# Patient Record
Sex: Female | Born: 1975 | Race: White | Hispanic: No | Marital: Married | Smoking: Never smoker
Health system: Southern US, Community
[De-identification: ages and names within clinical notes are randomized; demographics above are authoritative.]

## PROBLEM LIST (undated history)

## (undated) DIAGNOSIS — K219 Gastro-esophageal reflux disease without esophagitis: Secondary | ICD-10-CM

## (undated) DIAGNOSIS — N289 Disorder of kidney and ureter, unspecified: Secondary | ICD-10-CM

## (undated) DIAGNOSIS — N200 Calculus of kidney: Secondary | ICD-10-CM

## (undated) HISTORY — PX: KNEE SURGERY: SHX244

## (undated) HISTORY — DX: Gastro-esophageal reflux disease without esophagitis: K21.9

## (undated) HISTORY — PX: CHOLECYSTECTOMY: SHX55

---
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
## 2005-02-10 ENCOUNTER — Emergency Department (HOSPITAL_COMMUNITY): Admission: EM | Admit: 2005-02-10 | Discharge: 2005-02-10 | Payer: Self-pay | Admitting: Emergency Medicine

## 2007-10-16 ENCOUNTER — Encounter: Payer: Self-pay | Admitting: Family Medicine

## 2007-10-16 LAB — CONVERTED CEMR LAB: Free T4: 7 ng/dL

## 2008-01-07 ENCOUNTER — Ambulatory Visit: Payer: Self-pay | Admitting: Family Medicine

## 2008-01-07 DIAGNOSIS — R1013 Epigastric pain: Secondary | ICD-10-CM

## 2008-01-07 DIAGNOSIS — R1011 Right upper quadrant pain: Secondary | ICD-10-CM | POA: Insufficient documentation

## 2008-01-07 DIAGNOSIS — J45909 Unspecified asthma, uncomplicated: Secondary | ICD-10-CM | POA: Insufficient documentation

## 2008-01-07 DIAGNOSIS — K3189 Other diseases of stomach and duodenum: Secondary | ICD-10-CM | POA: Insufficient documentation

## 2008-01-07 LAB — CONVERTED CEMR LAB
Blood in Urine, dipstick: NEGATIVE
Glucose, Urine, Semiquant: NEGATIVE
Ketones, urine, test strip: NEGATIVE
Protein, U semiquant: NEGATIVE
Specific Gravity, Urine: 1.015
pH: 5.5

## 2008-01-08 ENCOUNTER — Encounter: Payer: Self-pay | Admitting: Family Medicine

## 2008-01-11 LAB — CONVERTED CEMR LAB
AST: 16 units/L (ref 0–37)
Albumin: 4.5 g/dL (ref 3.5–5.2)
BUN: 10 mg/dL (ref 6–23)
CO2: 25 meq/L (ref 19–32)
Calcium: 9.4 mg/dL (ref 8.4–10.5)
Chloride: 104 meq/L (ref 96–112)
Creatinine, Ser: 0.71 mg/dL (ref 0.40–1.20)
Glucose, Bld: 94 mg/dL (ref 70–99)
HCT: 42.8 % (ref 36.0–46.0)
Hemoglobin: 14.3 g/dL (ref 12.0–15.0)
Potassium: 4.1 meq/L (ref 3.5–5.3)
RBC: 4.7 M/uL (ref 3.87–5.11)
WBC: 12.6 10*3/uL — ABNORMAL HIGH (ref 4.0–10.5)

## 2008-01-26 ENCOUNTER — Encounter: Payer: Self-pay | Admitting: Family Medicine

## 2008-02-01 ENCOUNTER — Ambulatory Visit: Payer: Self-pay | Admitting: Family Medicine

## 2008-02-01 DIAGNOSIS — N912 Amenorrhea, unspecified: Secondary | ICD-10-CM | POA: Insufficient documentation

## 2008-02-02 LAB — CONVERTED CEMR LAB: hCG, Beta Chain, Quant, S: <2 milliintl units/mL

## 2008-03-08 ENCOUNTER — Encounter: Payer: Self-pay | Admitting: Family Medicine

## 2008-03-08 LAB — CONVERTED CEMR LAB: TSH: 1.63 microintl units/mL

## 2008-07-05 ENCOUNTER — Telehealth (INDEPENDENT_AMBULATORY_CARE_PROVIDER_SITE_OTHER): Payer: Self-pay | Admitting: *Deleted

## 2008-07-06 ENCOUNTER — Ambulatory Visit: Payer: Self-pay | Admitting: Family Medicine

## 2008-07-06 DIAGNOSIS — J019 Acute sinusitis, unspecified: Secondary | ICD-10-CM | POA: Insufficient documentation

## 2009-11-08 ENCOUNTER — Ambulatory Visit: Payer: Self-pay | Admitting: Family Medicine

## 2009-11-08 DIAGNOSIS — D239 Other benign neoplasm of skin, unspecified: Secondary | ICD-10-CM | POA: Insufficient documentation

## 2009-11-25 ENCOUNTER — Encounter: Payer: Self-pay | Admitting: Family Medicine

## 2010-08-07 NOTE — Assessment & Plan Note (Signed)
Summary: skin check   Vital Signs:  Patient profile:   35 year old female Height:      64 inches Weight:      233 pounds BMI:     40.14 O2 Sat:      100 % on Room air Pulse rate:   90 / minute BP sitting:   122 / 76  (left arm) Cuff size:   large  Vitals Entered By: Payton Spark CMA (Nov 08, 2009 4:02 PM)  O2 Flow:  Room air CC: Discuss moles. Has noticed more moles since pregnancy.    Primary Care Provider:  Seymour Bars DO  CC:  Discuss moles. Has noticed more moles since pregnancy. .  History of Present Illness: 35 yo WF presents for a change in her moles since she was pregnant.  She had 2 atypical moles removed in the past and denies a hx of SCC, BCC or melanoma.  She  has a hx of childhood sunburns and her Grandma had melanoma.    Current Medications (verified): 1)  Proventil Hfa 108 (90 Base) Mcg/act  Aers (Albuterol Sulfate) .... 2 Puff As Needed 2)  Beyaz 3-0.02-0.451 Mg Tabs (Drospiren-Eth Estrad-Levomefol) .... Take 1 Tab By Mouth Once Daily  Allergies (verified): 1)  ! Codeine 2)  ! Oxycodone Hcl 3)  ! * Pulmicort  Past History:  Past Medical History: G2P2 mild intermittent asthma (allergic rxn to pulmicort) GERD normal barium swallow 4-09 obese  Past Surgical History: LTCS 12-06, 06-2009 appendectomy 1989  Social History: Reviewed history from 01/07/2008 and no changes required. 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 Never smoked. Denies ETOH. No exericse. 222 pre-pregnancy weight. Married to [place], has a toddler son and an infant daughter  Review of Systems      See HPI  Physical Exam  General:  alert, well-developed, well-nourished, and well-hydrated.  obese Skin:  color normal.  several pale appearing SKs, pink pearly raised papule proximal R forearm.  tiny round freckle on the R thumb and a flat brown spreading macule on the L cheek Cervical Nodes:  No lymphadenopathy noted Psych:  good eye  contact, not anxious appearing, and not depressed appearing.     Impression & Recommendations:  Problem # 1:  NEVUS, ATYPICAL (ICD-216.9) Fairly benign appearing skin exam but concern over pearly pink papule on the R forearm -- possible BCC and fam hx of melanoma.   REfer to Dr Vernell Barrier in [place].  Educated on sunscreen use. Orders: Dermatology Referral ([facility])  Complete Medication List: 1)  Proventil Hfa 108 (90 Base) Mcg/act Aers (Albuterol sulfate) .... 2 puff as needed 2)  Beyaz 3-0.02-0.451 Mg Tabs (Drospiren-eth estrad-levomefol) .... Take 1 tab by mouth once daily  Patient Instructions: 1)  Will get you in with Dr Vernell Barrier for a skin check.

## 2010-08-07 NOTE — Consult Note (Signed)
Summary: Gae Dry MD Dermatology  Gae Dry MD Dermatology   Imported By: Lanelle Bal 12/07/2009 09:10:21  _____________________________________________________________________  External Attachment:    Type:   Image     Comment:   External Document

## 2010-11-13 ENCOUNTER — Telehealth: Payer: Self-pay | Admitting: Family Medicine

## 2010-11-13 NOTE — Telephone Encounter (Signed)
Pt called and thinks she has UTI.  Wants ab tx or suggestion for OTC medication.   Plan:  Pts call returned, and had to [facility].  Told pt will possibly need to see in office as nurse visit, but told to call back and speak with the triage nurse. Pending pt callback. Jarvis Newcomer, LPN [facility]

## 2010-11-16 ENCOUNTER — Ambulatory Visit (INDEPENDENT_AMBULATORY_CARE_PROVIDER_SITE_OTHER): Payer: PRIVATE HEALTH INSURANCE | Admitting: Family Medicine

## 2010-11-16 ENCOUNTER — Encounter: Payer: Self-pay | Admitting: Family Medicine

## 2010-11-16 VITALS — BP 131/85 | HR 83 | Temp 98.6°F | Wt 236.0 lb

## 2010-11-16 DIAGNOSIS — N390 Urinary tract infection, site not specified: Secondary | ICD-10-CM

## 2010-11-16 LAB — POCT URINALYSIS DIPSTICK
Glucose, UA: NEGATIVE
Spec Grav, UA: 1.01
Urobilinogen, UA: 0.2
pH, UA: 7

## 2010-11-16 MED ORDER — CIPROFLOXACIN HCL 250 MG PO TABS: 250.0000 mg | ORAL_TABLET | Freq: Two times a day (BID) | ORAL | Status: AC

## 2010-11-16 NOTE — Patient Instructions (Signed)
Take Cipro 2 x a day for 3 days for UTI.  Drink lots of water.  Call if symptoms not resolved by Monday.

## 2010-11-16 NOTE — Progress Notes (Signed)
  Subjective:    Patient ID: Kathy Kaiser, female    DOB: 18-Jan-1976, 35 y.o.   MRN: [id number]  HPI 35 yo WF presents for dysuria x 4 days, frequency, urgency.  She is trying to drink more water.  Tried AZO but it made her nauseated.  She has not had a UTI since pregancy a year ago.  No blood in the urine.  No vaginal discharge.  She had low grade fevers and chills.  She has had HAs.  She has some R flank pain, no pelvic pain.  No incontinence. No vomitting, a little nausea.    BP 131/85  Pulse 83  Temp(Src) 98.6 F (37 C) (Oral)  Wt 236 lb (107.049 kg)  SpO2 100%  LMP 09/26/2010     Review of Systems as per HPI     Objective:   Physical Exam  Constitutional: She appears well-developed and well-nourished. No distress.       obese  Cardiovascular: Normal rate, regular rhythm and normal heart sounds.   Pulmonary/Chest: Effort normal and breath sounds normal.  Abdominal: Soft. Bowel sounds are normal. She exhibits no distension. There is tenderness (slight suprapubic TTP). There is no rebound and no guarding.       No CVAT  Musculoskeletal: She exhibits no edema.  Skin: Skin is warm and dry.  Psychiatric: She has a normal mood and affect.          Assessment & Plan:  UTI - small Leuks on UA with symptoms c/w UTI. Will treat with Cipro 250 mg bid x 3 days.  Drink lots of water.  Cranberry tabs OK. Call if symptoms have not resolved in 72 hrs.

## 2010-12-06 NOTE — Telephone Encounter (Signed)
No response from the patient as of 12-06-10. Jarvis Newcomer, LPN [facility]

## 2011-11-07 ENCOUNTER — Encounter: Payer: Self-pay | Admitting: Family Medicine

## 2011-11-07 ENCOUNTER — Ambulatory Visit (INDEPENDENT_AMBULATORY_CARE_PROVIDER_SITE_OTHER): Payer: PRIVATE HEALTH INSURANCE | Admitting: Family Medicine

## 2011-11-07 VITALS — BP 109/74 | HR 79 | Ht 64.25 in | Wt 219.0 lb

## 2011-11-07 DIAGNOSIS — Z0000 Encounter for general adult medical examination without abnormal findings: Secondary | ICD-10-CM

## 2011-11-07 DIAGNOSIS — E663 Overweight: Secondary | ICD-10-CM

## 2011-11-07 LAB — COMPLETE METABOLIC PANEL WITH GFR
AST: 23 U/L (ref 0–37)
Alkaline Phosphatase: 37 U/L — ABNORMAL LOW (ref 39–117)
BUN: 10 mg/dL (ref 6–23)
Calcium: 9.1 mg/dL (ref 8.4–10.5)
Creat: 0.68 mg/dL (ref 0.50–1.10)
Total Bilirubin: 0.6 mg/dL (ref 0.3–1.2)

## 2011-11-07 LAB — LIPID PANEL
Cholesterol: 186 mg/dL (ref 0–200)
HDL: 39 mg/dL — ABNORMAL LOW (ref >39)
Total CHOL/HDL Ratio: 4.8 Ratio
Triglycerides: 152 mg/dL — ABNORMAL HIGH (ref <150)
VLDL: 30 mg/dL (ref 0–40)

## 2011-11-07 MED ORDER — ALBUTEROL SULFATE HFA 108 (90 BASE) MCG/ACT IN AERS: 2.0000 | INHALATION_SPRAY | RESPIRATORY_TRACT | Status: DC | PRN

## 2011-11-07 NOTE — Patient Instructions (Signed)
Start a regular exercise program and make sure you are eating a healthy diet Try to eat 4 servings of dairy a day or take a calcium supplement (500mg twice a day). Your vaccines are up to date.   

## 2011-11-07 NOTE — Progress Notes (Signed)
  Subjective:     Kathy Kaiser is a 36 y.o. female and is here for a comprehensive physical exam. The patient reports no problems.  Pap and breast exam are up to date per GYN.   History   Social History  . Marital Status: Married    Spouse Name: Barbara Cower    Number of Children: 2  . Years of Education: N/A   Occupational History  . Case Manager    Social History Main Topics  . Smoking status: Never Smoker   . Smokeless tobacco: Not on file  . Alcohol Use: No  . Drug Use: No  . Sexually Active: Yes -- Female partner(s)   Other Topics Concern  . Not on file   Social History Narrative   Occ exercise. Daily caffeine 1-2 per day.    Health Maintenance  Topic Date Due  . Influenza Vaccine  04/07/2012  . Pap Smear  10/24/2014  . Tetanus/tdap  04/07/2021    The following portions of the patient's history were reviewed and updated as appropriate: allergies, current medications, past family history, past medical history, past social history, past surgical history and problem list.  Review of Systems A comprehensive review of systems was negative.   Objective:    BP 109/74  Pulse 79  Ht 5' 4.25" (1.632 m)  Wt 219 lb (99.338 kg)  BMI 37.30 kg/m2 General appearance: alert, cooperative and appears stated age, mildly obese Head: Normocephalic, without obvious abnormality, atraumatic Eyes: conj clear, EOMi, PERRLA Ears: normal TM's and external ear canals both ears Nose: Nares normal. Septum midline. Mucosa normal. No drainage or sinus tenderness. Throat: lips, mucosa, and tongue normal; teeth and gums normal Neck: no adenopathy, no carotid bruit, no JVD, supple, symmetrical, trachea midline and thyroid not enlarged, symmetric, no tenderness/mass/nodules Back: symmetric, no curvature. ROM normal. No CVA tenderness. Lungs: clear to auscultation bilaterally Heart: regular rate and rhythm, S1, S2 normal, no murmur, click, rub or gallop Abdomen: soft, non-tender; bowel sounds normal; no  masses,  no organomegaly Extremities: extremities normal, atraumatic, no cyanosis or edema Pulses: 2+ and symmetric Skin: Skin color, texture, turgor normal. No rashes or lesions Lymph nodes: Cervical, supraclavicular, and axillary nodes normal. Neurologic: Alert and oriented X 3, normal strength and tone. Normal symmetric reflexes. Normal coordination and gait    Assessment:    Healthy female exam.      Plan:     See After Visit Summary for Counseling Recommendations  Start a regular exercise program and make sure you are eating a healthy diet Try to eat 4 servings of dairy a day or take a calcium supplement (500mg  twice a day). Your vaccines are up to date.  Given slip for CMP, lipids.   Had normal TSH a cuple of months ago at GYN office.

## 2012-03-13 ENCOUNTER — Ambulatory Visit: Payer: PRIVATE HEALTH INSURANCE | Admitting: Sports Medicine

## 2012-03-13 ENCOUNTER — Encounter: Payer: Self-pay | Admitting: Sports Medicine

## 2012-03-13 ENCOUNTER — Ambulatory Visit (INDEPENDENT_AMBULATORY_CARE_PROVIDER_SITE_OTHER): Payer: PRIVATE HEALTH INSURANCE | Admitting: Sports Medicine

## 2012-03-13 VITALS — BP 122/80 | HR 86 | Temp 97.7°F | Resp 16 | Wt 224.0 lb

## 2012-03-13 DIAGNOSIS — J45909 Unspecified asthma, uncomplicated: Secondary | ICD-10-CM

## 2012-03-13 MED ORDER — AZITHROMYCIN 250 MG PO TABS: ORAL_TABLET | ORAL | Status: AC

## 2012-03-13 MED ORDER — PREDNISONE 50 MG PO TABS: ORAL_TABLET | ORAL | Status: AC

## 2012-03-13 MED ORDER — ALBUTEROL SULFATE HFA 108 (90 BASE) MCG/ACT IN AERS: 2.0000 | INHALATION_SPRAY | RESPIRATORY_TRACT | Status: DC | PRN

## 2012-03-13 NOTE — Progress Notes (Signed)
Patient ID: Kathy Kaiser, female   DOB: [birth year], 36 y.o.   MRN: [id number] Subjective:    CC: cough  HPI: Kathy Kaiser is a very pleasant 36 year old female with a known history of mild intermittent asthma. Normally she does not get symptoms at night, and less than monthly during the day. She is only on albuterol. Unfortunately over the past few days she's noted increased usage, up to 8 times per day of her albuterol pump as well as cough, wheeze, and shortness of breath. She denies a production, denies any fevers, or chills. She denies any chest pain.  Past medical history, Surgical history, Family history, Social history, Allergies, and medications have been entered into the medical record, reviewed, and no changes needed.   Review of Systems: No fevers, chills, night sweats, weight loss, chest pain, or shortness of breath.   Objective:    General: Well Developed, well nourished, and in no acute distress.  Neuro: Alert and oriented x3, extra-ocular muscles intact.  HEENT: Normocephalic, atraumatic, pupils equal round reactive to light, neck supple, no masses, no lymphadenopathy, thyroid nonpalpable.  Skin: Warm and dry, no rashes. Cardiac: Regular rate and rhythm, no murmurs rubs or gallops.  Respiratory: Clear to auscultation bilaterally. Not using accessory muscles, speaking in full sentences.   Impression and Recommendations:

## 2012-03-13 NOTE — Assessment & Plan Note (Signed)
She's currently having a mild exacerbation. We'll treat her conservatively, prednisone, azithromycin, and refill her albuterol. She does note that she rarely gets symptoms, day or night, so I do not think we need to place her on a daily controller medicine.

## 2012-03-19 ENCOUNTER — Ambulatory Visit (INDEPENDENT_AMBULATORY_CARE_PROVIDER_SITE_OTHER): Payer: PRIVATE HEALTH INSURANCE

## 2012-03-19 ENCOUNTER — Telehealth: Payer: Self-pay | Admitting: Sports Medicine

## 2012-03-19 ENCOUNTER — Telehealth: Payer: Self-pay | Admitting: *Deleted

## 2012-03-19 DIAGNOSIS — R0602 Shortness of breath: Secondary | ICD-10-CM

## 2012-03-19 DIAGNOSIS — R05 Cough: Secondary | ICD-10-CM

## 2012-03-19 DIAGNOSIS — R059 Cough, unspecified: Secondary | ICD-10-CM

## 2012-03-19 NOTE — Telephone Encounter (Signed)
Pt informed

## 2012-03-19 NOTE — Telephone Encounter (Signed)
Go ahead and get a chest x-ray. I will place the orders. Should this be negative, with no sign of pneumonia, the tincture of time will be our saving grace.

## 2012-03-19 NOTE — Telephone Encounter (Signed)
Pt states she has taken abx and that she is using inhaler TID. States she doesn't feel as well as she thinks she should be feeling. Please advise.

## 2012-03-19 NOTE — Telephone Encounter (Signed)
Please let Kathy Kaiser of her x-rays are suggestive of findings classic with bronchitis. There is no sign of pneumonia. She will get better with time.

## 2012-03-20 NOTE — Telephone Encounter (Signed)
Pt informed

## 2012-05-14 ENCOUNTER — Encounter: Payer: Self-pay | Admitting: Family Medicine

## 2012-05-14 ENCOUNTER — Ambulatory Visit (INDEPENDENT_AMBULATORY_CARE_PROVIDER_SITE_OTHER): Payer: PRIVATE HEALTH INSURANCE | Admitting: Family Medicine

## 2012-05-14 VITALS — BP 115/71 | HR 75 | Ht 65.0 in | Wt 223.0 lb

## 2012-05-14 DIAGNOSIS — J329 Chronic sinusitis, unspecified: Secondary | ICD-10-CM

## 2012-05-14 DIAGNOSIS — J309 Allergic rhinitis, unspecified: Secondary | ICD-10-CM

## 2012-05-14 MED ORDER — AMOXICILLIN-POT CLAVULANATE 875-125 MG PO TABS: 1.0000 | ORAL_TABLET | Freq: Two times a day (BID) | ORAL | Status: DC

## 2012-05-14 MED ORDER — FLUTICASONE PROPIONATE 50 MCG/ACT NA SUSP: 2.0000 | Freq: Every day | NASAL | Status: DC

## 2012-05-14 NOTE — Patient Instructions (Signed)

## 2012-05-14 NOTE — Progress Notes (Signed)
  Subjective:    Patient ID: Kathy Kaiser, female    DOB: [birth year], 36 y.o.   MRN: [id number]  HPI Sinusitis x 1 week, green nasal mucous. No runny nose. + Cough, + HA.  HA near her left eye.  Pain over maxillary and frontal sinuses bilaterally.  Has tried mucinex, tylenol flu and cold.  Has used netti pot with brief relief.  No fever. No myalgias.  No GI sxs.  Has had to use her albuterol twice in the last week. Usually gets a sinus infection in the fall.  Dx with bronchits in September with Azith and prednisone.     Review of Systems     Objective:   Physical Exam  Constitutional: She is oriented to person, place, and time. She appears well-developed and well-nourished.  HENT:  Head: Normocephalic and atraumatic.  Right Ear: External ear normal.  Left Ear: External ear normal.  Nose: Nose normal.  Mouth/Throat: Oropharynx is clear and moist.       TMs and canals are clear.   Eyes: Conjunctivae normal and EOM are normal. Pupils are equal, round, and reactive to light.  Neck: Neck supple. No thyromegaly present.  Cardiovascular: Normal rate, regular rhythm and normal heart sounds.   Pulmonary/Chest: Effort normal and breath sounds normal. She has no wheezes.  Lymphadenopathy:    She has no cervical adenopathy.  Neurological: She is alert and oriented to person, place, and time.  Skin: Skin is warm and dry.  Psychiatric: She has a normal mood and affect.          Assessment & Plan:  Sinusitis - Will tx with augmentin for 10 days. Call if not better in one week. Continue symptomatic care.  Discussed adding a nasal steroid spray in addition to her zyrtec.

## 2012-06-18 ENCOUNTER — Encounter: Payer: Self-pay | Admitting: Family Medicine

## 2012-06-18 ENCOUNTER — Ambulatory Visit (INDEPENDENT_AMBULATORY_CARE_PROVIDER_SITE_OTHER): Payer: PRIVATE HEALTH INSURANCE | Admitting: Family Medicine

## 2012-06-18 ENCOUNTER — Other Ambulatory Visit: Payer: Self-pay | Admitting: Sports Medicine

## 2012-06-18 VITALS — BP 119/78 | HR 102 | Ht 65.0 in | Wt 227.0 lb

## 2012-06-18 DIAGNOSIS — F419 Anxiety disorder, unspecified: Secondary | ICD-10-CM

## 2012-06-18 DIAGNOSIS — R000 Tachycardia, unspecified: Secondary | ICD-10-CM

## 2012-06-18 DIAGNOSIS — Z862 Personal history of diseases of the blood and blood-forming organs and certain disorders involving the immune mechanism: Secondary | ICD-10-CM

## 2012-06-18 DIAGNOSIS — Z8639 Personal history of other endocrine, nutritional and metabolic disease: Secondary | ICD-10-CM

## 2012-06-18 DIAGNOSIS — F411 Generalized anxiety disorder: Secondary | ICD-10-CM

## 2012-06-18 MED ORDER — CLONAZEPAM 1 MG PO TABS: 0.5000 mg | ORAL_TABLET | Freq: Two times a day (BID) | ORAL | Status: DC | PRN

## 2012-06-18 NOTE — Progress Notes (Signed)
Subjective:    Patient ID: Kathy Kaiser, female    DOB: [birth year], 36 y.o.   MRN: [id number]  HPI  #1 history of thyroid disease. Patient reports last Tuesday or about 10 days ago she started noticing palpitations nervousness extreme anxiety and just feeling close in. The last time this happened was when she was pregnant almost 3 years ago and at that time she was found to have hyperthyroidism with a low TSH. This family history of Hashimoto's disease and other thyroid disease as well. At that time her GYN repeated the TSH a few weeks later and found and was normal. She's had repeat TSH but the best of her knowledge has been probably over a year since it does not appear that she had a TSH done in May. To punctate with a  Review of Systems  HENT: Negative for facial swelling.   Eyes: Negative for discharge and itching.  Respiratory: Negative for apnea, choking and chest tightness.   Cardiovascular: Positive for palpitations. Negative for chest pain and leg swelling.  Psychiatric/Behavioral: Positive for sleep disturbance and agitation. Negative for suicidal ideas, behavioral problems, confusion, self-injury, dysphoric mood and decreased concentration. The patient is nervous/anxious.   All other systems reviewed and are negative.      BP 119/78  Pulse 102  Ht 5\' 5"  (1.651 m)  Wt 227 lb (102.967 kg)  BMI 37.77 kg/m2 Objective:   Physical Exam  Vitals reviewed. Constitutional: She is oriented to person, place, and time. She appears well-developed and well-nourished. She appears distressed.  HENT:  Head: Normocephalic.  Eyes: Pupils are equal, round, and reactive to light.  Neck: Normal range of motion. Neck supple. No tracheal deviation present. No thyromegaly present.  Cardiovascular: Normal rate, regular rhythm and normal heart sounds.   Pulmonary/Chest: Effort normal and breath sounds normal. No respiratory distress. She has no wheezes.  Musculoskeletal: Normal range of motion. She  exhibits no edema.  Neurological: She is alert and oriented to person, place, and time.  Skin: Skin is warm. She is not diaphoretic.  Psychiatric: She has a normal mood and affect. Her behavior is normal.      Results for orders placed in visit on 11/07/11  LIPID PANEL      Component Value Range   Cholesterol 186  0 - 200 mg/dL   Triglycerides 409 (*) <150 mg/dL   HDL 39 (*) >81 mg/dL   Total CHOL/HDL Ratio 4.8     VLDL 30  0 - 40 mg/dL   LDL Cholesterol 191 (*) 0 - 99 mg/dL  COMPLETE METABOLIC PANEL WITH GFR      Component Value Range   Sodium 137  135 - 145 mEq/L   Potassium 4.2  3.5 - 5.3 mEq/L   Chloride 103  96 - 112 mEq/L   CO2 27  19 - 32 mEq/L   Glucose, Bld 94  70 - 99 mg/dL   BUN 10  6 - 23 mg/dL   Creat 4.78  2.95 - 6.21 mg/dL   Total Bilirubin 0.6  0.3 - 1.2 mg/dL   Alkaline Phosphatase 37 (*) 39 - 117 U/L   AST 23  0 - 37 U/L   ALT 34  0 - 35 U/L   Total Protein 7.2  6.0 - 8.3 g/dL   Albumin 4.5  3.5 - 5.2 g/dL   Calcium 9.1  8.4 - 30.8 mg/dL   GFR, Est African American >89     GFR, Est Non African American >  89    EKG within nomal limits. Except for mild sinus arrhythmia.        #1 anxiety disorder/history of hyper thyroidism/palpitations. At this time not completely sure if this is a hyperthyroid attack since her physical examination is essentially benign. Will treat for anxiety with a prescription for Klonopin 1 mg one half to one tablet once twice a day when necessary and obtain lab work for further thyroid evaluation. Patient denies any depression so instead using an SSRI we'll go Klonopin. Followup with Dr. Linford Arnold in about 2 weeks.

## 2012-06-18 NOTE — Patient Instructions (Signed)
Hyperthyroidism The thyroid is a large gland located in the lower front part of your neck. The thyroid helps control metabolism. Metabolism is how your body uses food. It controls metabolism with the hormone thyroxine. When the thyroid is overactive, it produces too much hormone. When this happens, these following problems may occur:   Nervousness  Heat intolerance  Weight loss (in spite of increase food intake)  Diarrhea  Change in hair or skin texture  Palpitations (heart skipping or having extra beats)  Tachycardia (rapid heart rate)  Loss of menstruation (amenorrhea)  Shaking of the hands CAUSES  Grave's Disease (the immune system attacks the thyroid gland). This is the most common cause.  Inflammation of the thyroid gland.  Tumor (usually benign) in the thyroid gland or elsewhere.  Excessive use of thyroid medications (both prescription and 'natural').  Excessive ingestion of Iodine. DIAGNOSIS  To prove hyperthyroidism, your caregiver may do blood tests and ultrasound tests. Sometimes the signs are hidden. It may be necessary for your caregiver to watch this illness with blood tests, either before or after diagnosis and treatment. TREATMENT Short-term treatment There are several treatments to control symptoms. Drugs called beta blockers may give some relief. Drugs that decrease hormone production will provide temporary relief in many people. These measures will usually not give permanent relief. Definitive therapy There are treatments available which can be discussed between you and your caregiver which will permanently treat the problem. These treatments range from surgery (removal of the thyroid), to the use of radioactive iodine (destroys the thyroid by radiation), to the use of antithyroid drugs (interfere with hormone synthesis). The first two treatments are permanent and usually successful. They most often require hormone replacement therapy for life. This is because  it is impossible to remove or destroy the exact amount of thyroid required to make a person euthyroid (normal). HOME CARE INSTRUCTIONS  See your caregiver if the problems you are being treated for get worse. Examples of this would be the problems listed above. SEEK MEDICAL CARE IF: Your general condition worsens. MAKE SURE YOU:   Understand these instructions.  Will watch your condition.  Will get help right away if you are not doing well or get worse. Document Released: 06/24/2005 Document Revised: 09/16/2011 Document Reviewed: 11/05/2006 [facility] Patient Information 2013 [place], [place]. Anxiety and Panic Attacks Your caregiver has informed you that you are having an anxiety or panic attack. There may be many forms of this. Most of the time these attacks come suddenly and without warning. They come at any time of day, including periods of sleep, and at any time of life. They may be strong and unexplained. Although panic attacks are very scary, they are physically harmless. Sometimes the cause of your anxiety is not known. Anxiety is a protective mechanism of the body in its fight or flight mechanism. Most of these perceived danger situations are actually nonphysical situations (such as anxiety over losing a job). CAUSES  The causes of an anxiety or panic attack are many. Panic attacks may occur in otherwise healthy people given a certain set of circumstances. There may be a genetic cause for panic attacks. Some medications may also have anxiety as a side effect. SYMPTOMS  Some of the most common feelings are:  Intense terror.  Dizziness, feeling faint.  Hot and cold flashes.  Fear of going crazy.  Feelings that nothing is real.  Sweating.  Shaking.  Chest pain or a fast heartbeat (palpitations).  Smothering, choking sensations.  Feelings of  impending doom and that death is near.  Tingling of extremities, this may be from over-breathing.  Altered reality  (derealization).  Being detached from yourself (depersonalization). Several symptoms can be present to make up anxiety or panic attacks. DIAGNOSIS  The evaluation by your caregiver will depend on the type of symptoms you are experiencing. The diagnosis of anxiety or panic attack is made when no physical illness can be determined to be a cause of the symptoms. TREATMENT  Treatment to prevent anxiety and panic attacks may include:  Avoidance of circumstances that cause anxiety.  Reassurance and relaxation.  Regular exercise.  Relaxation therapies, such as yoga.  Psychotherapy with a psychiatrist or therapist.  Avoidance of caffeine, alcohol and illegal drugs.  Prescribed medication. SEEK IMMEDIATE MEDICAL CARE IF:   You experience panic attack symptoms that are different than your usual symptoms.  You have any worsening or concerning symptoms. Document Released: 06/24/2005 Document Revised: 09/16/2011 Document Reviewed: 10/26/2009 [facility] Patient Information 2013 [place], [place].

## 2012-06-19 LAB — CBC WITH DIFFERENTIAL/PLATELET
Eosinophils Absolute: 0.2 10*3/uL (ref 0.0–0.7)
Eosinophils Relative: 2 % (ref 0–5)
Lymphs Abs: 2.8 10*3/uL (ref 0.7–4.0)
MCH: 31.2 pg (ref 26.0–34.0)
MCHC: 35.2 g/dL (ref 30.0–36.0)
MCV: 88.7 fL (ref 78.0–100.0)
Platelets: 299 10*3/uL (ref 150–400)
RBC: 4.61 MIL/uL (ref 3.87–5.11)

## 2012-06-19 LAB — COMPREHENSIVE METABOLIC PANEL
ALT: 86 U/L — ABNORMAL HIGH (ref 0–35)
AST: 40 U/L — ABNORMAL HIGH (ref 0–37)
Alkaline Phosphatase: 35 U/L — ABNORMAL LOW (ref 39–117)
Glucose, Bld: 107 mg/dL — ABNORMAL HIGH (ref 70–99)
Sodium: 139 mEq/L (ref 135–145)
Total Bilirubin: 0.6 mg/dL (ref 0.3–1.2)
Total Protein: 7.5 g/dL (ref 6.0–8.3)

## 2012-06-22 ENCOUNTER — Encounter: Payer: Self-pay | Admitting: *Deleted

## 2012-06-24 LAB — THYROID PANEL WITH TSH
Free Thyroxine Index: 2.6 (ref 1.0–3.9)
T3 Uptake: 33.2 % (ref 22.5–37.0)
T4, Total: 7.9 ug/dL (ref 5.0–12.5)
TSH: 1.707 u[IU]/mL (ref 0.350–4.500)

## 2012-07-03 ENCOUNTER — Ambulatory Visit: Payer: PRIVATE HEALTH INSURANCE | Admitting: Family Medicine

## 2012-10-14 ENCOUNTER — Encounter: Payer: Self-pay | Admitting: Family Medicine

## 2012-10-14 ENCOUNTER — Ambulatory Visit (INDEPENDENT_AMBULATORY_CARE_PROVIDER_SITE_OTHER): Payer: PRIVATE HEALTH INSURANCE | Admitting: Family Medicine

## 2012-10-14 ENCOUNTER — Other Ambulatory Visit: Payer: Self-pay | Admitting: Family Medicine

## 2012-10-14 ENCOUNTER — Ambulatory Visit (INDEPENDENT_AMBULATORY_CARE_PROVIDER_SITE_OTHER): Payer: PRIVATE HEALTH INSURANCE

## 2012-10-14 VITALS — BP 120/85 | HR 105 | Ht 65.0 in | Wt 225.0 lb

## 2012-10-14 DIAGNOSIS — IMO0002 Reserved for concepts with insufficient information to code with codable children: Secondary | ICD-10-CM

## 2012-10-14 DIAGNOSIS — S76012A Strain of muscle, fascia and tendon of left hip, initial encounter: Secondary | ICD-10-CM

## 2012-10-14 DIAGNOSIS — M25552 Pain in left hip: Secondary | ICD-10-CM

## 2012-10-14 DIAGNOSIS — M25559 Pain in unspecified hip: Secondary | ICD-10-CM

## 2012-10-14 MED ORDER — METAXALONE 800 MG PO TABS: ORAL_TABLET | ORAL | Status: DC

## 2012-10-14 MED ORDER — HYDROCODONE-ACETAMINOPHEN 5-325 MG PO TABS: 1.0000 | ORAL_TABLET | Freq: Every day | ORAL | Status: DC

## 2012-10-14 MED ORDER — MELOXICAM 15 MG PO TABS: 15.0000 mg | ORAL_TABLET | Freq: Every day | ORAL | Status: DC

## 2012-10-14 NOTE — Progress Notes (Signed)
CC: Kathy Kaiser is a 37 y.o. female is here for Hip Pain   Subjective: HPI:  Patient complains of severe left hip pain. Localized to the left lateral buttock radiating to the side. This came on suddenly of moderate severity while getting out of her car yesterday. Since then has gradually getting worse on an hourly basis. Pain is described as sharp and burning. Present with sitting, walking, lying on left side but slightly improved when standing still. Has tried ibuprofen and Tylenol without improvement at all. Pain is nonradiating. Pain is present all hours of the day. Woke her from sleep rolling over her left side yesterday evening.  Denies weakness or sensory disturbances in the left lower extremity. Denies groin pain, abdominal pain, pelvic pain, genitourinary complaints, vaginal discharge, dysuria, constipation, nor diarrhea. Denies recent trauma or overexertion   Review Of Systems Outlined In HPI  Past Medical History  Diagnosis Date  . GERD (gastroesophageal reflux disease)   . Asthma     mild intermittent     Family History  Problem Relation Age of Onset  . GER disease Mother   . Thyroid disease Mother   . Hypertension Father   . Thyroid disease Sister   . Diabetes Sister     Gestational  . Diabetes type II Father      History  Substance Use Topics  . Smoking status: Never Smoker   . Smokeless tobacco: Not on file  . Alcohol Use: No     Objective: Filed Vitals:   10/14/12 0943  BP: 120/85  Pulse: 105    Vital signs reviewed. General: Alert and Oriented, No Acute Distress HEENT: Pupils equal, round, reactive to light. Conjunctivae clear.  External ears unremarkable.  Moist mucous membranes. Lungs: Clear and comfortable work of breathing, speaking in full sentences without accessory muscle use. Cardiac: Regular rate and rhythm.  Neuro: CN II-XII grossly intact, limping favoring right leg. Full-strength and range of motion in bilateral lower extremities with L4  and S1 DTRs two over four bilaterally. MSK: Evaluation of the left leg: Straight leg raise negative, logroll negative, leg length equal, FABER/FADIR reproduces pain in the left posterior lateral buttock. No pain over left greater trochanter. Pain is reproduced with palpation of posterior lateral pelvic brim.  Extremities: No peripheral edema.  Strong peripheral pulses.  Mental Status: No depression, anxiety, nor agitation. Logical though process. Skin: Warm and dry.  Assessment & Plan: [name] was seen today for hip pain.  Diagnoses and associated orders for this visit:  Left hip pain - Cancel: DG Pelvis 1-2 Views; Future - metaxalone (SKELAXIN) 800 MG tablet; Take one tab every 8 hours only as needed for muscle pain and/or spasm. - meloxicam (MOBIC) 15 MG tablet; Take 1 tablet (15 mg total) by mouth daily. - HYDROcodone-acetaminophen (NORCO/VICODIN) 5-325 MG per tablet; Take 1 tablet by mouth at bedtime.  Muscle strain of gluteal region, left, initial encounter - metaxalone (SKELAXIN) 800 MG tablet; Take one tab every 8 hours only as needed for muscle pain and/or spasm. - meloxicam (MOBIC) 15 MG tablet; Take 1 tablet (15 mg total) by mouth daily. - HYDROcodone-acetaminophen (NORCO/VICODIN) 5-325 MG per tablet; Take 1 tablet by mouth at bedtime.    X-rays of the hip and pelvis were obtained ruled out avulsion fracture. We'll treat as gluteal strain/sprain including rest, Skelaxin and Mobic with nighttime use of hydrocodone. I've asked her to return early next week for sports medicine referral with Dr. Karie Schwalbe. to discuss confirmation of diagnosis  and physical therapy benefits.  Return in about 1 week (around 10/21/2012).

## 2012-11-24 ENCOUNTER — Encounter: Payer: PRIVATE HEALTH INSURANCE | Admitting: Family Medicine

## 2012-12-03 ENCOUNTER — Encounter: Payer: Self-pay | Admitting: Family Medicine

## 2012-12-03 ENCOUNTER — Other Ambulatory Visit: Payer: Self-pay | Admitting: *Deleted

## 2012-12-03 ENCOUNTER — Ambulatory Visit (INDEPENDENT_AMBULATORY_CARE_PROVIDER_SITE_OTHER): Payer: PRIVATE HEALTH INSURANCE | Admitting: Family Medicine

## 2012-12-03 ENCOUNTER — Other Ambulatory Visit: Payer: Self-pay | Admitting: Sports Medicine

## 2012-12-03 VITALS — BP 112/69 | HR 66 | Ht 65.0 in | Wt 220.0 lb

## 2012-12-03 DIAGNOSIS — J45909 Unspecified asthma, uncomplicated: Secondary | ICD-10-CM

## 2012-12-03 DIAGNOSIS — E079 Disorder of thyroid, unspecified: Secondary | ICD-10-CM

## 2012-12-03 DIAGNOSIS — J309 Allergic rhinitis, unspecified: Secondary | ICD-10-CM

## 2012-12-03 DIAGNOSIS — Z0000 Encounter for general adult medical examination without abnormal findings: Secondary | ICD-10-CM

## 2012-12-03 LAB — LIPID PANEL
HDL: 38 mg/dL — ABNORMAL LOW (ref >39)
LDL Cholesterol: 90 mg/dL (ref 0–99)
Total CHOL/HDL Ratio: 3.9 Ratio

## 2012-12-03 LAB — COMPLETE METABOLIC PANEL WITH GFR
ALT: 25 U/L (ref 0–35)
Albumin: 4.7 g/dL (ref 3.5–5.2)
Alkaline Phosphatase: 30 U/L — ABNORMAL LOW (ref 39–117)
CO2: 24 mEq/L (ref 19–32)
Glucose, Bld: 96 mg/dL (ref 70–99)
Potassium: 4.1 mEq/L (ref 3.5–5.3)
Sodium: 135 mEq/L (ref 135–145)
Total Bilirubin: 0.8 mg/dL (ref 0.3–1.2)
Total Protein: 7.1 g/dL (ref 6.0–8.3)

## 2012-12-03 LAB — TSH: TSH: 1.174 u[IU]/mL (ref 0.350–4.500)

## 2012-12-03 MED ORDER — ALBUTEROL SULFATE HFA 108 (90 BASE) MCG/ACT IN AERS: 2.0000 | INHALATION_SPRAY | RESPIRATORY_TRACT | Status: DC | PRN

## 2012-12-03 MED ORDER — BECLOMETHASONE DIPROPIONATE 40 MCG/ACT IN AERS: 2.0000 | INHALATION_SPRAY | Freq: Two times a day (BID) | RESPIRATORY_TRACT | Status: DC

## 2012-12-03 NOTE — Patient Instructions (Signed)
Keep up a regular exercise program and make sure you are eating a healthy diet Try to eat 4 servings of dairy a day, or if you are lactose intolerant take a calcium with vitamin D daily.  Your vaccines are up to date.   

## 2012-12-03 NOTE — Progress Notes (Signed)
  Subjective:     Kathy Kaiser is a 37 y.o. female and is here for a comprehensive physical exam. The patient reports problems - asthma has been flaring lat couple of weeks. Has had to use her albuterol 4 times today.She usually has more problems in Spring.  She is using her nasal steroid spray but not her antihistamine.l   History   Social History  . Marital Status: Married    Spouse Name: Barbara Cower    Number of Children: 2  . Years of Education: N/A   Occupational History  . Case Manager    Social History Main Topics  . Smoking status: Never Smoker   . Smokeless tobacco: Not on file  . Alcohol Use: Yes     Comment: rarely  . Drug Use: No  . Sexually Active: Yes -- Female partner(s)   Other Topics Concern  . Not on file   Social History Narrative   Occ exercise 3-4 days per week. . Daily caffeine 1-2 per day.    Health Maintenance  Topic Date Due  . Influenza Vaccine  03/08/2013  . Pap Smear  12/03/2015  . Tetanus/tdap  04/07/2021    The following portions of the patient's history were reviewed and updated as appropriate: allergies, current medications, past family history, past medical history, past social history, past surgical history and problem list.  Review of Systems A comprehensive review of systems was negative.   Objective:    BP 112/69  Pulse 66  Ht 5\' 5"  (1.651 m)  Wt 220 lb (99.791 kg)  BMI 36.61 kg/m2 General appearance: alert, cooperative and appears stated age Head: Normocephalic, without obvious abnormality, atraumatic Eyes: conj clear EOMi, PEERLA Ears: normal TM's and external ear canals both ears Nose: Nares normal. Septum midline. Mucosa normal. No drainage or sinus tenderness. Throat: lips, mucosa, and tongue normal; teeth and gums normal Neck: Mild asymmetry of the thyroid, larger on the right. No nodules.  No LN.  NEck with NROM.  Back: symmetric, no curvature. ROM normal. No CVA tenderness. Lungs: clear to auscultation bilaterally Heart:  regular rate and rhythm, S1, S2 normal, no murmur, click, rub or gallop Abdomen: soft, non-tender; bowel sounds normal; no masses,  no organomegaly Extremities: extremities normal, atraumatic, no cyanosis or edema Pulses: 2+ and symmetric Skin: Skin color, texture, turgor normal. No rashes or lesions Lymph nodes: Cervical, supraclavicular, and axillary nodes normal. Neurologic: Alert and oriented X 3, normal strength and tone. Normal symmetric reflexes. Normal coordination and gait    Assessment:    Healthy female exam.      Plan:     See After Visit Summary for Counseling Recommendations  Keep up a regular exercise program and make sure you are eating a healthy diet Try to eat 4 servings of dairy a day, or if you are lactose intolerant take a calcium with vitamin D daily.  Your vaccines are up to date.   Asthma - uncontrolled.  Will add inhaled steroid. Will start QVAR 40mg  BID.  F/U in 4 months.    AR- restart antihistamine with the inhaled nasal corticosteroid.    Asymmetrical thyroid - Will refer for [place] for further eval. Has strong fam hx of thyroid prlblems. Check TSH as well.

## 2012-12-08 ENCOUNTER — Ambulatory Visit (INDEPENDENT_AMBULATORY_CARE_PROVIDER_SITE_OTHER): Payer: PRIVATE HEALTH INSURANCE

## 2012-12-08 ENCOUNTER — Encounter: Payer: Self-pay | Admitting: Family Medicine

## 2012-12-08 DIAGNOSIS — E079 Disorder of thyroid, unspecified: Secondary | ICD-10-CM

## 2013-07-09 ENCOUNTER — Encounter: Payer: Self-pay | Admitting: Physician Assistant

## 2013-07-09 ENCOUNTER — Ambulatory Visit (INDEPENDENT_AMBULATORY_CARE_PROVIDER_SITE_OTHER): Payer: PRIVATE HEALTH INSURANCE | Admitting: Physician Assistant

## 2013-07-09 VITALS — BP 121/81 | HR 95 | Wt 221.0 lb

## 2013-07-09 DIAGNOSIS — R0602 Shortness of breath: Secondary | ICD-10-CM

## 2013-07-09 DIAGNOSIS — R05 Cough: Secondary | ICD-10-CM

## 2013-07-09 DIAGNOSIS — J45909 Unspecified asthma, uncomplicated: Secondary | ICD-10-CM

## 2013-07-09 DIAGNOSIS — R059 Cough, unspecified: Secondary | ICD-10-CM

## 2013-07-09 DIAGNOSIS — F411 Generalized anxiety disorder: Secondary | ICD-10-CM

## 2013-07-09 DIAGNOSIS — F410 Panic disorder [episodic paroxysmal anxiety] without agoraphobia: Secondary | ICD-10-CM

## 2013-07-09 MED ORDER — CLONAZEPAM 0.5 MG PO TABS: 0.5000 mg | ORAL_TABLET | Freq: Two times a day (BID) | ORAL | Status: DC | PRN

## 2013-07-09 NOTE — Patient Instructions (Signed)
umcka cold care for cough.  Finish prednisone and amoxicillin.  Decrease albuterol nebs.  klonapin as needed for anxiety.

## 2013-07-09 NOTE — Progress Notes (Signed)
   Subjective:    Patient ID: Kathy Kaiser, female    DOB: December 31, 1975, 38 y.o.   MRN: [id number]  HPI Pt presents to the clinic to follow up for bronchitis and flu like symptoms for over 1 week. Pt was seen in urgent care on 07/03/13 and given amoxicillin and prednisone. Pt has 2 days left of prednisone and 5 days left of abx. She feels better overall but still has cough that is dry and episodes where she feels very SOB like she can't breath in all the way. Denies fever, chills, n/v/d, sinus pressure, ST, ear pain, or wheezing. She would like to be rechecked right now. She admits to being very anxious. She is using abulterol regularly but not helping. Her grandmother is in the hospital dying and she is very worried.   Review of Systems     Objective:   Physical Exam  Constitutional: She is oriented to person, place, and time. She appears well-developed and well-nourished.  HENT:  Head: Normocephalic and atraumatic.  Right Ear: External ear normal.  Left Ear: External ear normal.  Nose: Nose normal.  Mouth/Throat: Oropharynx is clear and moist. No oropharyngeal exudate.  Tm's clear.  Negative maxillary or frontal sinus tenderness.   Eyes: Conjunctivae are normal. Right eye exhibits no discharge. Left eye exhibits no discharge.  Neck: Normal range of motion. Neck supple.  Cardiovascular: Normal rate, regular rhythm and normal heart sounds.   Pulmonary/Chest: Effort normal and breath sounds normal. She has no wheezes.  Lymphadenopathy:    She has no cervical adenopathy.  Neurological: She is alert and oriented to person, place, and time.  Skin: Skin is warm and dry.  Psychiatric: She has a normal mood and affect. Her behavior is normal.          Assessment & Plan:  cough/anxiety/panic attacks/SOb- peak flows in green zone.  reassured pt that lungs sound great. Cough likely post-infectious. Discussed OTC umcka cold care for cough. Offered hycodan pt declined. Encouraged to finish  prednisone and amoxicillin. I would decrease or even stop all together albuterol nebulizer. I think nebs and prednisone are causing more anxiety and panic attacks that are causing pt to feel SOB. I did give klonapin to use PRN when she has a SOB attack.  Likely when finish prednisone and stop albuterol will start to feel better. Continue on qvar for maintence.Call if worsening or not improving.

## 2013-11-10 ENCOUNTER — Ambulatory Visit (INDEPENDENT_AMBULATORY_CARE_PROVIDER_SITE_OTHER): Payer: PRIVATE HEALTH INSURANCE | Admitting: Family Medicine

## 2013-11-10 ENCOUNTER — Telehealth: Payer: Self-pay | Admitting: *Deleted

## 2013-11-10 ENCOUNTER — Encounter: Payer: Self-pay | Admitting: Family Medicine

## 2013-11-10 VITALS — BP 121/76 | HR 82 | Ht 65.0 in | Wt 221.0 lb

## 2013-11-10 DIAGNOSIS — F411 Generalized anxiety disorder: Secondary | ICD-10-CM | POA: Insufficient documentation

## 2013-11-10 DIAGNOSIS — Z0000 Encounter for general adult medical examination without abnormal findings: Secondary | ICD-10-CM

## 2013-11-10 DIAGNOSIS — Z23 Encounter for immunization: Secondary | ICD-10-CM

## 2013-11-10 DIAGNOSIS — J45909 Unspecified asthma, uncomplicated: Secondary | ICD-10-CM

## 2013-11-10 LAB — COMPLETE METABOLIC PANEL WITH GFR
ALT: 24 U/L (ref 0–35)
AST: 18 U/L (ref 0–37)
Albumin: 4.5 g/dL (ref 3.5–5.2)
Alkaline Phosphatase: 29 U/L — ABNORMAL LOW (ref 39–117)
BILIRUBIN TOTAL: 0.8 mg/dL (ref 0.2–1.2)
BUN: 6 mg/dL (ref 6–23)
CO2: 25 mEq/L (ref 19–32)
Calcium: 9.1 mg/dL (ref 8.4–10.5)
Chloride: 104 mEq/L (ref 96–112)
Creat: 0.71 mg/dL (ref 0.50–1.10)
GFR, Est African American: >89 mL/min
GFR, Est Non African American: >89 mL/min
Glucose, Bld: 95 mg/dL (ref 70–99)
Potassium: 4.2 mEq/L (ref 3.5–5.3)
SODIUM: 140 meq/L (ref 135–145)
TOTAL PROTEIN: 7 g/dL (ref 6.0–8.3)

## 2013-11-10 LAB — LIPID PANEL
Cholesterol: 144 mg/dL (ref 0–200)
HDL: 41 mg/dL (ref >39)
LDL CALC: 85 mg/dL (ref 0–99)
TRIGLYCERIDES: 88 mg/dL (ref <150)
Total CHOL/HDL Ratio: 3.5 Ratio
VLDL: 18 mg/dL (ref 0–40)

## 2013-11-10 MED ORDER — FLUOXETINE HCL 10 MG PO CAPS: ORAL_CAPSULE | ORAL | Status: DC

## 2013-11-10 NOTE — Telephone Encounter (Signed)
Sorry, should say one tab daily for one week then increase to 2 tabs daily.

## 2013-11-10 NOTE — Patient Instructions (Signed)
Keep up a regular exercise program and make sure you are eating a healthy diet Try to eat 4 servings of dairy a day, or if you are lactose intolerant take a calcium with vitamin D daily.  Your vaccines are up to date.   

## 2013-11-10 NOTE — Telephone Encounter (Signed)
Pharmacy calls and states got an e-script for Prozac capsules for this patient and the directions say take 1 tab daily for 1 week then increase to 1 tab daily.  Did you mean 1/2 tab and if so she said they are in capsules so can not be cut in half. Clemetine Marker, LPN

## 2013-11-10 NOTE — Progress Notes (Signed)
Subjective:     Kathy Kaiser is a 38 y.o. female and is here for a comprehensive physical exam. The patient reports no problems.  She also feels like her anxiety is been increased recently. She's had a couple weeks ago she felt fine but this last week her anxiety has been very high. She says she's not sure what may have triggered it. She tried taking the clonazepam in the past unfortunately it actually possibly summation so does not want to take this again. She feels that her mood has been up and down more recently. Without any significant stressors that she can pinpoint.   Her asthma has been very well controlled. She's had used her rescue inhaler once in the last month. She is on Qvar daily.  History   Social History  . Marital Status: Married    Spouse Name: Corene Cornea    Number of Children: 2  . Years of Education: N/A   Occupational History  . Case Manager    Social History Main Topics  . Smoking status: Never Smoker   . Smokeless tobacco: Not on file  . Alcohol Use: Yes     Comment: rarely  . Drug Use: No  . Sexual Activity: Yes    Partners: Male   Other Topics Concern  . Not on file   Social History Narrative   Occ exercise 3-4 days per week. . Daily caffeine 1-2 per day.    Health Maintenance  Topic Date Due  . Influenza Vaccine  02/05/2014  . Pap Smear  12/03/2015  . Tetanus/tdap  04/07/2021    The following portions of the patient's history were reviewed and updated as appropriate: allergies, current medications, past family history, past medical history, past social history, past surgical history and problem list.  Review of Systems A comprehensive review of systems was negative.   Objective:    BP 121/76  Pulse 82  Ht 5\' 5"  (1.651 m)  Wt 221 lb (100.245 kg)  BMI 36.78 kg/m2 General appearance: alert, cooperative and appears stated age Head: Normocephalic, without obvious abnormality, atraumatic Eyes: conj clear, EOMi, PEERLA Ears: normal TM's and  external ear canals both ears Nose: Nares normal. Septum midline. Mucosa normal. No drainage or sinus tenderness. Throat: lips, mucosa, and tongue normal; teeth and gums normal Neck: no adenopathy, no carotid bruit, no JVD, supple, symmetrical, trachea midline and thyroid not enlarged, symmetric, no tenderness/mass/nodules Back: symmetric, no curvature. ROM normal. No CVA tenderness. Lungs: clear to auscultation bilaterally Breasts: not insepcted Heart: regular rate and rhythm, S1, S2 normal, no murmur, click, rub or gallop Abdomen: soft, non-tender; bowel sounds normal; no masses,  no organomegaly Extremities: extremities normal, atraumatic, no cyanosis or edema Pulses: 2+ and symmetric Skin: Skin color, texture, turgor normal. No rashes or lesions Lymph nodes: Cervical, supraclavicular, and axillary nodes normal. Neurologic: Alert and oriented X 3, normal strength and tone. Normal symmetric reflexes. Normal coordination and gait    Assessment:    Healthy female exam.     Plan:     See After Visit Summary for Counseling Recommendations  Keep up a regular exercise program and make sure you are eating a healthy diet Try to eat 4 servings of dairy a day, or if you are lactose intolerant take a calcium with vitamin D daily.  Your vaccines are up to date.   GAD- not well controlled right now. We discussed different options. I would like to stay away from the benzodiazepine class and she had hallucinations  on clonazepam. We discussed a daily medication such as an SSRI. Discussed pros and cons and potential side effects of this. We'll start fluoxetine 10 mg daily. Increase to 20 mg after one week. Followup in one month to see how well she tolerated. She's to call the office in the interim if she feels that she's having significant side effects or increase in her symptoms.  Asthma-recommended pneumococcal 23 injection. Patient received today. Well controlled on current regimen. Consider statin  therapy at followup visit if she still well controlled.

## 2013-11-10 NOTE — Telephone Encounter (Signed)
Pharmacy informed.  Oscar La, LPN

## 2013-11-11 LAB — TSH: TSH: 1.165 u[IU]/mL (ref 0.350–4.500)

## 2013-11-16 ENCOUNTER — Encounter: Payer: Self-pay | Admitting: Family Medicine

## 2013-11-30 ENCOUNTER — Encounter: Payer: Self-pay | Admitting: Family Medicine

## 2013-12-20 ENCOUNTER — Ambulatory Visit: Payer: PRIVATE HEALTH INSURANCE | Admitting: Family Medicine

## 2014-03-25 ENCOUNTER — Other Ambulatory Visit: Payer: Self-pay | Admitting: Family Medicine

## 2014-03-25 DIAGNOSIS — J45909 Unspecified asthma, uncomplicated: Secondary | ICD-10-CM

## 2014-03-25 MED ORDER — ALBUTEROL SULFATE HFA 108 (90 BASE) MCG/ACT IN AERS: 2.0000 | INHALATION_SPRAY | RESPIRATORY_TRACT | Status: DC | PRN

## 2014-03-25 MED ORDER — BECLOMETHASONE DIPROPIONATE 40 MCG/ACT IN AERS: 2.0000 | INHALATION_SPRAY | Freq: Two times a day (BID) | RESPIRATORY_TRACT | Status: DC

## 2014-11-16 ENCOUNTER — Encounter: Payer: Self-pay | Admitting: Family Medicine

## 2014-11-16 ENCOUNTER — Ambulatory Visit (INDEPENDENT_AMBULATORY_CARE_PROVIDER_SITE_OTHER): Payer: PRIVATE HEALTH INSURANCE | Admitting: Family Medicine

## 2014-11-16 VITALS — BP 121/84 | HR 79 | Wt 216.0 lb

## 2014-11-16 DIAGNOSIS — Z0000 Encounter for general adult medical examination without abnormal findings: Secondary | ICD-10-CM | POA: Diagnosis not present

## 2014-11-16 DIAGNOSIS — R1032 Left lower quadrant pain: Secondary | ICD-10-CM

## 2014-11-16 DIAGNOSIS — J4520 Mild intermittent asthma, uncomplicated: Secondary | ICD-10-CM | POA: Diagnosis not present

## 2014-11-16 LAB — COMPLETE METABOLIC PANEL WITH GFR
ALBUMIN: 4.3 g/dL (ref 3.5–5.2)
ALT: 22 U/L (ref 0–35)
AST: 13 U/L (ref 0–37)
Alkaline Phosphatase: 28 U/L — ABNORMAL LOW (ref 39–117)
BUN: 11 mg/dL (ref 6–23)
CO2: 25 mEq/L (ref 19–32)
Calcium: 9.1 mg/dL (ref 8.4–10.5)
Chloride: 106 mEq/L (ref 96–112)
Creat: 0.66 mg/dL (ref 0.50–1.10)
GFR, Est African American: >89 mL/min
GLUCOSE: 94 mg/dL (ref 70–99)
POTASSIUM: 4.1 meq/L (ref 3.5–5.3)
Sodium: 140 mEq/L (ref 135–145)
TOTAL PROTEIN: 7 g/dL (ref 6.0–8.3)
Total Bilirubin: 0.6 mg/dL (ref 0.2–1.2)

## 2014-11-16 LAB — LIPID PANEL
CHOL/HDL RATIO: 3.9 ratio
Cholesterol: 154 mg/dL (ref 0–200)
HDL: 39 mg/dL — AB (ref >=46)
LDL Cholesterol: 98 mg/dL (ref 0–99)
TRIGLYCERIDES: 84 mg/dL (ref <150)
VLDL: 17 mg/dL (ref 0–40)

## 2014-11-16 MED ORDER — ALBUTEROL SULFATE HFA 108 (90 BASE) MCG/ACT IN AERS: 2.0000 | INHALATION_SPRAY | Freq: Four times a day (QID) | RESPIRATORY_TRACT | Status: DC | PRN

## 2014-11-16 MED ORDER — BECLOMETHASONE DIPROPIONATE 40 MCG/ACT IN AERS: 2.0000 | INHALATION_SPRAY | Freq: Two times a day (BID) | RESPIRATORY_TRACT | Status: DC

## 2014-11-16 MED ORDER — FLUTICASONE PROPIONATE 50 MCG/ACT NA SUSP: 2.0000 | Freq: Every day | NASAL | Status: DC

## 2014-11-16 NOTE — Patient Instructions (Signed)
Keep up a regular exercise program and make sure you are eating a healthy diet Try to eat 4 servings of dairy a day, or if you are lactose intolerant take a calcium with vitamin D daily.  Your vaccines are up to date.   

## 2014-11-16 NOTE — Progress Notes (Signed)
Subjective:     Kathy Kaiser is a 39 y.o. female and is here for a comprehensive physical exam. The patient reports no problems.  Asthma- overall she's doing well. She's not had used her albuterol in a most 3 months. She would like to try different albuterol as the Proventil was $60 for 1 inhaler. She says she does have times of years ago where she has certain triggers like mold where she uses her albuterol much more frequently. Next  Anxiety-she is no longer on the fluoxetine.  She did have some left lower quadrant pain that was sharp during intercourse a couple of months ago. She decided to go to her GYN. They did an ultrasound but did not see any comp locations or evidence of cyst etc. He felt like it was most likely a rupture of the cyst. She said she did well until this past weekend, about 4 days ago when she started having left lower quadrant pain that lasted all day. She notes that she has been little bit more constipated than usual. The pain has completely resolved and has not bothered her since then. She did start her period this week.  History   Social History  . Marital Status: Married    Spouse Name: Corene Cornea  . Number of Children: 2  . Years of Education: N/A   Occupational History  . Case Manager    Social History Main Topics  . Smoking status: Never Smoker   . Smokeless tobacco: Not on file  . Alcohol Use: Yes     Comment: rarely  . Drug Use: No  . Sexual Activity:    Partners: Male   Other Topics Concern  . Not on file   Social History Narrative   Occ exercise 3-4 days per week. . Daily caffeine 1-2 per day.    Health Maintenance  Topic Date Due  . HIV Screening  06/22/1993  . INFLUENZA VACCINE  02/06/2015  . PAP SMEAR  12/03/2015  . TETANUS/TDAP  04/07/2021    The following portions of the patient's history were reviewed and updated as appropriate: allergies, current medications, past family history, past medical history, past social history, past surgical  history and problem list.  Review of Systems A comprehensive review of systems was negative.   Objective:    BP 121/84 mmHg  Pulse 79  Wt 216 lb (97.977 kg)  SpO2 100% General appearance: alert, cooperative and appears stated age Head: Normocephalic, without obvious abnormality, atraumatic Eyes: conj clear, EOMi, PEERLA Ears: normal TM's and external ear canals both ears Nose: Nares normal. Septum midline. Mucosa normal. No drainage or sinus tenderness. Throat: lips, mucosa, and tongue normal; teeth and gums normal Neck: no adenopathy, no carotid bruit, no JVD, supple, symmetrical, trachea midline and thyroid not enlarged, symmetric, no tenderness/mass/nodules Back: symmetric, no curvature. ROM normal. No CVA tenderness. Lungs: clear to auscultation bilaterally Breasts: normal appearance, no masses or tenderness Heart: regular rate and rhythm, S1, S2 normal, no murmur, click, rub or gallop Abdomen: soft, non-tender; bowel sounds normal; no masses,  no organomegaly Extremities: extremities normal, atraumatic, no cyanosis or edema Pulses: 2+ and symmetric Skin: Skin color, texture, turgor normal. No rashes or lesions Lymph nodes: Cervical, supraclavicular, and axillary nodes normal. Neurologic: Alert and oriented X 3, normal strength and tone. Normal symmetric reflexes. Normal coordination and gait    Assessment:    Healthy female exam.     Plan:     See After Visit Summary for Counseling Recommendations  Keep up a regular exercise program and make sure you are eating a healthy diet Try to eat 4 servings of dairy a day, or if you are lactose intolerant take a calcium with vitamin D daily.  Your vaccines are up to date.   Asthma, mild intermittent-continue when necessary use of albuterol. Will change to ProAir HFA.    Left lower quadrant pain-unclear etiology. She had a negative ultrasound couple months ago. We did discuss using a stool softener or possibly a laxative to get  her bowels moving and see if this helps. If she continues to get recurrent pain then I recommend a repeat ultrasound for further evaluation.

## 2014-11-17 LAB — HIV ANTIBODY (ROUTINE TESTING W REFLEX): HIV: NONREACTIVE

## 2015-02-13 ENCOUNTER — Ambulatory Visit (INDEPENDENT_AMBULATORY_CARE_PROVIDER_SITE_OTHER): Payer: PRIVATE HEALTH INSURANCE

## 2015-02-13 ENCOUNTER — Ambulatory Visit (INDEPENDENT_AMBULATORY_CARE_PROVIDER_SITE_OTHER): Payer: PRIVATE HEALTH INSURANCE | Admitting: Family Medicine

## 2015-02-13 ENCOUNTER — Encounter: Payer: Self-pay | Admitting: Family Medicine

## 2015-02-13 VITALS — BP 134/93 | HR 79 | Wt 228.0 lb

## 2015-02-13 DIAGNOSIS — R200 Anesthesia of skin: Secondary | ICD-10-CM | POA: Diagnosis not present

## 2015-02-13 DIAGNOSIS — M4806 Spinal stenosis, lumbar region: Secondary | ICD-10-CM | POA: Diagnosis not present

## 2015-02-13 DIAGNOSIS — M51369 Other intervertebral disc degeneration, lumbar region without mention of lumbar back pain or lower extremity pain: Secondary | ICD-10-CM | POA: Insufficient documentation

## 2015-02-13 DIAGNOSIS — M5136 Other intervertebral disc degeneration, lumbar region: Secondary | ICD-10-CM | POA: Insufficient documentation

## 2015-02-13 NOTE — Progress Notes (Signed)
Kathy Kaiser is a 39 y.o. female who presents to [facility]  today for leg numbness. Patient is a several day history of bilateral foot and leg numbness. She feels sensation most on the plantar feet bilaterally. Sometimes the numbness goes to the mid thighs. She denies any weakness or tingling or bowel or bladder dysfunction. She has not tried any treatment yet. She feels well no fevers chills nausea vomiting or diarrhea. No injury.   Past Medical History  Diagnosis Date  . GERD (gastroesophageal reflux disease)   . Asthma     mild intermittent   Past Surgical History  Procedure Laterality Date  . Appendectomy  1989  . Cesarean section  06/2005     06/2009   History  Substance Use Topics  . Smoking status: Never Smoker   . Smokeless tobacco: Not on file  . Alcohol Use: Yes     Comment: rarely   ROS as above Medications: Current Outpatient Prescriptions  Medication Sig Dispense Refill  . albuterol (PROVENTIL HFA;VENTOLIN HFA) 108 (90 BASE) MCG/ACT inhaler Inhale 2 puffs into the lungs every 6 (six) hours as needed for wheezing or shortness of breath. 18 g 3  . beclomethasone (QVAR) 40 MCG/ACT inhaler Inhale 2 puffs into the lungs 2 (two) times daily. 1 Inhaler 11  . cetirizine (ZYRTEC) 10 MG tablet Take 10 mg by mouth daily.    . fluticasone (FLONASE) 50 MCG/ACT nasal spray Place 2 sprays into both nostrils daily. 16 g 11   No current facility-administered medications for this visit.   Allergies  Allergen Reactions  . Budesonide Other (See Comments)    Throat irritation.   . Clonazepam Other (See Comments)    Hallucinations   . Codeine Nausea And Vomiting  . Oxycodone Hcl Hives     Exam:  BP 134/93 mmHg  Pulse 79  Wt 228 lb (103.42 kg) Gen: Well NAD HEENT: EOMI,  MMM Lungs: Normal work of breathing. CTABL Heart: RRR no MRG Abd: NABS, Soft. Nondistended, Nontender Exts: Brisk capillary refill, warm and well perfused.  Back:  Nontender to midline normal back range of motion Negative straight leg raise test and Faber test bilaterally. Reflexes are equal and normal bilateral knees and ankle. Sensation is intact throughout with subjective difference distally compared to proximally. Monofilament is normal on the dorsal and plantar aspect of the feet bilaterally  No results found for this or any previous visit (from the past 24 hour(s)). No results found.   Please see individual assessment and plan sections.

## 2015-02-13 NOTE — Patient Instructions (Signed)
Thank you for coming in today. Get xrays and labs.  Return as needed if not getting better.  Come back or go to the emergency room if you notice new weakness new numbness problems walking or bowel or bladder problems.

## 2015-02-13 NOTE — Assessment & Plan Note (Signed)
Subjective numbness. Plan for watchful waiting with lumbar x-ray and metabolic panel as well as TSH today. Return if worsening or not better. At that time would consider nerve conduction study versus lumbar MRI.

## 2015-02-14 ENCOUNTER — Ambulatory Visit: Payer: PRIVATE HEALTH INSURANCE | Admitting: Family Medicine

## 2015-02-14 LAB — COMPLETE METABOLIC PANEL WITH GFR
ALBUMIN: 4.1 g/dL (ref 3.6–5.1)
ALT: 19 U/L (ref 6–29)
AST: 17 U/L (ref 10–30)
Alkaline Phosphatase: 28 U/L — ABNORMAL LOW (ref 33–115)
BILIRUBIN TOTAL: 0.4 mg/dL (ref 0.2–1.2)
BUN: 9 mg/dL (ref 7–25)
CALCIUM: 9.1 mg/dL (ref 8.6–10.2)
CHLORIDE: 104 mmol/L (ref 98–110)
CO2: 27 mmol/L (ref 20–31)
Creat: 0.66 mg/dL (ref 0.50–1.10)
GFR, Est Non African American: >89 mL/min (ref >=60)
Glucose, Bld: 86 mg/dL (ref 65–99)
POTASSIUM: 4.2 mmol/L (ref 3.5–5.3)
Sodium: 140 mmol/L (ref 135–146)
TOTAL PROTEIN: 6.5 g/dL (ref 6.1–8.1)

## 2015-02-14 LAB — TSH: TSH: 1.873 u[IU]/mL (ref 0.350–4.500)

## 2015-02-14 NOTE — Progress Notes (Signed)
Quick Note:  Xray shows a PARS defect that is a normal variant and likely not causing the symptoms. Labs looked OK ______

## 2015-02-17 ENCOUNTER — Encounter: Payer: Self-pay | Admitting: Family Medicine

## 2015-02-21 ENCOUNTER — Encounter: Payer: Self-pay | Admitting: Family Medicine

## 2015-02-21 ENCOUNTER — Ambulatory Visit (INDEPENDENT_AMBULATORY_CARE_PROVIDER_SITE_OTHER): Payer: PRIVATE HEALTH INSURANCE | Admitting: Family Medicine

## 2015-02-21 VITALS — BP 114/76 | HR 77 | Wt 220.0 lb

## 2015-02-21 DIAGNOSIS — R200 Anesthesia of skin: Secondary | ICD-10-CM

## 2015-02-21 DIAGNOSIS — R202 Paresthesia of skin: Secondary | ICD-10-CM | POA: Diagnosis not present

## 2015-02-21 LAB — MAGNESIUM: Magnesium: 1.9 mg/dL (ref 1.5–2.5)

## 2015-02-21 LAB — VITAMIN B12: VITAMIN B 12: 442 pg/mL (ref 211–911)

## 2015-02-21 MED ORDER — PREDNISONE 20 MG PO TABS: 40.0000 mg | ORAL_TABLET | Freq: Every day | ORAL | Status: DC

## 2015-02-21 NOTE — Progress Notes (Signed)
   Subjective:    Patient ID: Kathy Kaiser, female    DOB: January 28, 1976, 39 y.o.   MRN: [id number]  HPI  Patient had come in on August 8 for bilateral lower leg and foot numbness. Complete metabolic panel and thyroid are normal. Numbness bilateral feet and legs. she reports that the numbness is moving up her leg. Says feels like her thighs stay tight as well.  Says after a shower will get a band like feeling around her waist.  There is no painful. Says it is better than it was.  Having some low back pain today but is supposed ot start her period on Friday. Taking IBU, Aleve. Not really helping.  Has impeded her walking as well. No problems with twisting motion hot from cold sensation. She denies any recent trauma, falls or injuries that she that could have caused this.   Review of Systems     Objective:   Physical Exam  Constitutional: She appears well-developed and well-nourished.  Musculoskeletal:  Tender over the lower lumbar spine. Nontender over the SI joints. Normal lumbar flexion and extension. Hip, knee, ankle strength is 5 out of 5 bilaterally. Patellar reflexes 1+ bilaterally. Monofilament test over feet and lower legs is completely normal and symmetric.  Skin: Skin is warm and dry.  Psychiatric: She has a normal mood and affect. Her behavior is normal.          Assessment & Plan:  Numbness legs- unclear etiology- will chekc B12  Level.  We will try round of prednisone for 5 days. If not better then will get MRI of lumbar spine. Call if gets worse.

## 2015-03-03 ENCOUNTER — Encounter: Payer: Self-pay | Admitting: Family Medicine

## 2015-03-03 DIAGNOSIS — R202 Paresthesia of skin: Principal | ICD-10-CM

## 2015-03-03 DIAGNOSIS — R200 Anesthesia of skin: Secondary | ICD-10-CM

## 2015-03-09 ENCOUNTER — Telehealth: Payer: Self-pay

## 2015-03-09 DIAGNOSIS — R200 Anesthesia of skin: Secondary | ICD-10-CM

## 2015-03-09 NOTE — Telephone Encounter (Signed)
[facility] called and left a message stating the MRI should be without and not with and without. Please advise.    [facility] [phone]

## 2015-03-10 NOTE — Telephone Encounter (Signed)
[facility] for without.

## 2015-03-10 NOTE — Telephone Encounter (Signed)
Faxed to [phone]

## 2015-03-21 ENCOUNTER — Encounter: Payer: Self-pay | Admitting: Family Medicine

## 2015-03-21 DIAGNOSIS — G609 Hereditary and idiopathic neuropathy, unspecified: Secondary | ICD-10-CM

## 2015-03-30 LAB — TSH: TSH: 1.181 u[IU]/mL (ref 0.350–4.500)

## 2015-03-30 LAB — THYROID PEROXIDASE ANTIBODY: Thyroperoxidase Ab SerPl-aCnc: 2 IU/mL (ref <9)

## 2015-03-30 LAB — T4, FREE: FREE T4: 1.01 ng/dL (ref 0.80–1.80)

## 2015-03-30 LAB — HEMOGLOBIN A1C
Hgb A1c MFr Bld: 5.3 % (ref <5.7)
Mean Plasma Glucose: 105 mg/dL (ref <117)

## 2015-03-30 LAB — T3, FREE: T3 FREE: 3 pg/mL (ref 2.3–4.2)

## 2015-04-05 LAB — THYROID STIMULATING IMMUNOGLOBULIN: TSI: 44 %{baseline} (ref <140)

## 2015-05-19 ENCOUNTER — Ambulatory Visit (INDEPENDENT_AMBULATORY_CARE_PROVIDER_SITE_OTHER): Payer: PRIVATE HEALTH INSURANCE | Admitting: Family Medicine

## 2015-05-19 ENCOUNTER — Encounter: Payer: Self-pay | Admitting: Family Medicine

## 2015-05-19 ENCOUNTER — Other Ambulatory Visit (HOSPITAL_COMMUNITY)
Admission: RE | Admit: 2015-05-19 | Discharge: 2015-05-19 | Disposition: A | Discharge status: Home / Self Care | Payer: PRIVATE HEALTH INSURANCE | Source: Ambulatory Visit | Attending: Family Medicine | Admitting: Family Medicine

## 2015-05-19 VITALS — BP 121/69 | HR 90 | Temp 98.2°F | Resp 18 | Wt 216.0 lb

## 2015-05-19 DIAGNOSIS — Z124 Encounter for screening for malignant neoplasm of cervix: Secondary | ICD-10-CM | POA: Diagnosis not present

## 2015-05-19 DIAGNOSIS — Z1151 Encounter for screening for human papillomavirus (HPV): Secondary | ICD-10-CM | POA: Insufficient documentation

## 2015-05-19 DIAGNOSIS — J4520 Mild intermittent asthma, uncomplicated: Secondary | ICD-10-CM

## 2015-05-19 DIAGNOSIS — Z01419 Encounter for gynecological examination (general) (routine) without abnormal findings: Secondary | ICD-10-CM | POA: Diagnosis present

## 2015-05-19 NOTE — Progress Notes (Signed)
   Subjective:    Patient ID: Kathy Kaiser, female    DOB: [birth year], 39 y.o.   MRN: [id number]  HPI Asthma - doing well overall. She is not using her Qvar. Normal activity.  She is able to exercise some. She is mostly been walking for exercise. She says if she runs it starts to bother her hip and causes that tingling sensation down into her leg. But she has been working on it and has lost several pounds since I last saw her. She reports that she has been eating more healthy which is fantastic.  Due for pap smear. She was on her period when she came in for her CPE.  No problems. No pelvic pain or abdominal discharge.   Review of Systems     Objective:   Physical Exam  Constitutional: She is oriented to person, place, and time. She appears well-developed and well-nourished.  HENT:  Head: Normocephalic and atraumatic.  Cardiovascular: Normal rate, regular rhythm and normal heart sounds.   Pulmonary/Chest: Effort normal and breath sounds normal.  Genitourinary: Uterus normal. There is no rash, tenderness, lesion or injury on the right labia. There is no rash, tenderness, lesion or injury on the left labia. Cervix exhibits no motion tenderness, no discharge and no friability. Right adnexum displays no mass, no tenderness and no fullness. Left adnexum displays no mass, no tenderness and no fullness. No erythema, tenderness or bleeding in the vagina. No foreign body around the vagina. No signs of injury around the vagina. No vaginal discharge found.  Neurological: She is alert and oriented to person, place, and time.  Skin: Skin is warm and dry.  Psychiatric: She has a normal mood and affect. Her behavior is normal.        Assessment & Plan:  Asthma - mild, intermittant.  Off of Qvar.  Doing well. F/U in 6 months.    Cervical cancer screening. Pap smear performed today. We'll call with results once available.

## 2015-05-23 LAB — CYTOLOGY - PAP

## 2015-05-24 NOTE — Progress Notes (Signed)
Quick Note:  Call patient: Your Pap smear is normal. Repeat in 5 years. ______ 

## 2015-06-21 ENCOUNTER — Encounter: Payer: Self-pay | Admitting: Family Medicine

## 2015-10-16 ENCOUNTER — Encounter: Payer: Self-pay | Admitting: Family Medicine

## 2015-10-16 ENCOUNTER — Ambulatory Visit (INDEPENDENT_AMBULATORY_CARE_PROVIDER_SITE_OTHER): Payer: PRIVATE HEALTH INSURANCE | Admitting: Family Medicine

## 2015-10-16 VITALS — BP 128/85 | HR 73 | Temp 98.1°F | Wt 213.0 lb

## 2015-10-16 DIAGNOSIS — A499 Bacterial infection, unspecified: Secondary | ICD-10-CM

## 2015-10-16 DIAGNOSIS — J329 Chronic sinusitis, unspecified: Secondary | ICD-10-CM | POA: Diagnosis not present

## 2015-10-16 DIAGNOSIS — B9689 Other specified bacterial agents as the cause of diseases classified elsewhere: Secondary | ICD-10-CM

## 2015-10-16 MED ORDER — AMOXICILLIN-POT CLAVULANATE 500-125 MG PO TABS: ORAL_TABLET | ORAL | Status: AC

## 2015-10-16 NOTE — Progress Notes (Signed)
CC: Kathy Kaiser is a 40 y.o. female is here for Sinusitis   Subjective: HPI:  Two weeks of nasal congestion, facial pressure in the cheeks and upper molars. No benefit from ibuprofen, sinus medication, Flonase, allergy medication, Nettie pot. Symptoms are getting worse on a daily basis. Worse with leaning forward. It all started with the sniffles but this resolved without any other pain resolving. Symptoms are present all hours of the day moderate severity. denies vision loss, fevers, chills, nor sore throat   Review Of Systems Outlined In HPI  Past Medical History  Diagnosis Date  . GERD (gastroesophageal reflux disease)   . Asthma     mild intermittent    Past Surgical History  Procedure Laterality Date  . Appendectomy  1989  . Cesarean section  06/2005     06/2009   Family History  Problem Relation Age of Onset  . GER disease Mother   . Thyroid disease Mother   . Hypertension Father   . Thyroid disease Sister   . Diabetes Sister     Gestational  . Diabetes type II Father   . Heart disease      Social History   Social History  . Marital Status: Married    Spouse Name: Corene Cornea  . Number of Children: 2  . Years of Education: N/A   Occupational History  . Case Manager    Social History Main Topics  . Smoking status: Never Smoker   . Smokeless tobacco: Not on file  . Alcohol Use: Yes     Comment: rarely  . Drug Use: No  . Sexual Activity:    Partners: Male   Other Topics Concern  . Not on file   Social History Narrative   Occ exercise 3-4 days per week. . Daily caffeine 1-2 per day.      Objective: BP 128/85 mmHg  Pulse 73  Temp(Src) 98.1 F (36.7 C) (Oral)  Wt 213 lb (96.616 kg)  General: Alert and Oriented, No Acute Distress HEENT: Pupils equal, round, reactive to light. Conjunctivae clear.  External ears unremarkable, canals clear with intact TMs with appropriate landmarks.  Middle ear appears open without effusion. Pink inferior turbinates.   Moist mucous membranes, pharynx without inflammation nor lesions.  Neck supple without palpable lymphadenopathy nor abnormal masses. Lungs: Clear to auscultation bilaterally, no wheezing/ronchi/rales.  Comfortable work of breathing. Good air movement. Extremities: No peripheral edema.  Strong peripheral pulses.  Mental Status: No depression, anxiety, nor agitation. Skin: Warm and dry.  Assessment & Plan: Breigh was seen today for sinusitis.  Diagnoses and all orders for this visit:  Bacterial sinusitis -     amoxicillin-clavulanate (AUGMENTIN) 500-125 MG tablet; Take one by mouth every 8 hours for ten total days.  Rectal sinusitis: Start on Augmentin and call if no better by Thursday.   Return if symptoms worsen or fail to improve.

## 2015-10-19 ENCOUNTER — Encounter: Payer: Self-pay | Admitting: Family Medicine

## 2015-11-16 ENCOUNTER — Encounter: Payer: PRIVATE HEALTH INSURANCE | Admitting: Family Medicine

## 2015-11-23 ENCOUNTER — Encounter: Payer: Self-pay | Admitting: Family Medicine

## 2015-11-23 ENCOUNTER — Ambulatory Visit (INDEPENDENT_AMBULATORY_CARE_PROVIDER_SITE_OTHER): Payer: PRIVATE HEALTH INSURANCE | Admitting: Family Medicine

## 2015-11-23 VITALS — BP 120/74 | HR 80 | Wt 208.0 lb

## 2015-11-23 DIAGNOSIS — J4520 Mild intermittent asthma, uncomplicated: Secondary | ICD-10-CM | POA: Diagnosis not present

## 2015-11-23 DIAGNOSIS — H8110 Benign paroxysmal vertigo, unspecified ear: Secondary | ICD-10-CM | POA: Diagnosis not present

## 2015-11-23 DIAGNOSIS — Z0000 Encounter for general adult medical examination without abnormal findings: Secondary | ICD-10-CM | POA: Diagnosis not present

## 2015-11-23 LAB — COMPLETE METABOLIC PANEL WITH GFR
ALBUMIN: 4.4 g/dL (ref 3.6–5.1)
ALK PHOS: 30 U/L — AB (ref 33–115)
ALT: 26 U/L (ref 6–29)
AST: 19 U/L (ref 10–30)
BUN: 8 mg/dL (ref 7–25)
CALCIUM: 8.6 mg/dL (ref 8.6–10.2)
CO2: 26 mmol/L (ref 20–31)
Chloride: 104 mmol/L (ref 98–110)
Creat: 0.64 mg/dL (ref 0.50–1.10)
GFR, Est African American: >89 mL/min (ref >=60)
Glucose, Bld: 86 mg/dL (ref 65–99)
POTASSIUM: 4.2 mmol/L (ref 3.5–5.3)
SODIUM: 140 mmol/L (ref 135–146)
Total Bilirubin: 0.8 mg/dL (ref 0.2–1.2)
Total Protein: 6.9 g/dL (ref 6.1–8.1)

## 2015-11-23 LAB — LIPID PANEL
CHOL/HDL RATIO: 3.6 ratio (ref <=5.0)
CHOLESTEROL: 153 mg/dL (ref 125–200)
HDL: 42 mg/dL — ABNORMAL LOW (ref >=46)
LDL Cholesterol: 95 mg/dL (ref <130)
Triglycerides: 79 mg/dL (ref <150)
VLDL: 16 mg/dL (ref <30)

## 2015-11-23 LAB — TSH: TSH: 1.15 m[IU]/L

## 2015-11-23 MED ORDER — ALBUTEROL SULFATE HFA 108 (90 BASE) MCG/ACT IN AERS: 2.0000 | INHALATION_SPRAY | Freq: Four times a day (QID) | RESPIRATORY_TRACT | Status: DC | PRN

## 2015-11-23 MED ORDER — BECLOMETHASONE DIPROPIONATE 40 MCG/ACT IN AERS: 2.0000 | INHALATION_SPRAY | Freq: Two times a day (BID) | RESPIRATORY_TRACT | Status: DC

## 2015-11-23 MED ORDER — MECLIZINE HCL 25 MG PO TABS: 25.0000 mg | ORAL_TABLET | Freq: Three times a day (TID) | ORAL | Status: DC | PRN

## 2015-11-23 NOTE — Progress Notes (Signed)
  Subjective:     Kathy Kaiser is a 40 y.o. female and is here for a comprehensive physical exam. The patient reports problems - none.  Social History   Social History  . Marital Status: Married    Spouse Name: Corene Cornea  . Number of Children: 2  . Years of Education: N/A   Occupational History  . Case Manager    Social History Main Topics  . Smoking status: Never Smoker   . Smokeless tobacco: Not on file  . Alcohol Use: Yes     Comment: rarely  . Drug Use: No  . Sexual Activity:    Partners: Male   Other Topics Concern  . Not on file   Social History Narrative   Occ exercise 3-4 days per week. . Daily caffeine 1-2 per day.    Health Maintenance  Topic Date Due  . INFLUENZA VACCINE  02/06/2016  . PAP SMEAR  05/18/2018  . TETANUS/TDAP  04/07/2021  . HIV Screening  Completed    The following portions of the patient's history were reviewed and updated as appropriate: allergies, current medications, past family history, past medical history, past social history, past surgical history and problem list.  Review of Systems A comprehensive review of systems was negative.   Objective:    BP 120/74 mmHg  Pulse 80  Wt 208 lb (94.348 kg)  SpO2 100% General appearance: alert, cooperative and appears stated age Head: Normocephalic, without obvious abnormality, atraumatic Eyes: conj clear, EOMi, PEERLA Ears: normal TM's and external ear canals both ears Nose: Nares normal. Septum midline. Mucosa normal. No drainage or sinus tenderness. Throat: lips, mucosa, and tongue normal; teeth and gums normal Neck: no adenopathy, no carotid bruit, no JVD, supple, symmetrical, trachea midline and thyroid not enlarged, symmetric, no tenderness/mass/nodules Back: symmetric, no curvature. ROM normal. No CVA tenderness. Lungs: clear to auscultation bilaterally Breasts: normal appearance, no masses or tenderness Heart: regular rate and rhythm, S1, S2 normal, no murmur, click, rub or  gallop Abdomen: soft, non-tender; bowel sounds normal; no masses,  no organomegaly Pelvic: deferred Extremities: extremities normal, atraumatic, no cyanosis or edema Pulses: 2+ and symmetric Skin: Skin color, texture, turgor normal. No rashes or lesions Lymph nodes: Cervical adenopathy: nl, Axillary adenopathy: nl and Supraclavicular adenopathy: nl Neurologic: Alert and oriented X 3, normal strength and tone. Normal symmetric reflexes. Normal coordination and gait    Assessment:    Healthy female exam.      Plan:     See After Visit Summary for Counseling Recommendations  complete physical examination Keep up a regular exercise program and make sure you are eating a healthy diet Try to eat 4 servings of dairy a day, or if you are lactose intolerant take a calcium with vitamin D daily.  Your vaccines are up to date.   She Has been working out 5-6 days per week and is doing fantastic and watching her weight and eating healthy.  She does to get vertigo from time to time and would like to have a prescription for meclizine to use as needed.  She does a refill on her asthma inhalers. She said she's doing very well and doesn't number the last time she actually had an exacerbation. She recently decreased her Qvar.

## 2015-11-24 NOTE — Progress Notes (Signed)
Quick Note:  All labs are normal. ______ 

## 2016-02-13 ENCOUNTER — Encounter: Payer: Self-pay | Admitting: Family Medicine

## 2016-02-14 ENCOUNTER — Ambulatory Visit (INDEPENDENT_AMBULATORY_CARE_PROVIDER_SITE_OTHER): Payer: PRIVATE HEALTH INSURANCE | Admitting: Osteopathic Medicine

## 2016-02-14 ENCOUNTER — Encounter: Payer: Self-pay | Admitting: Osteopathic Medicine

## 2016-02-14 VITALS — BP 107/72 | HR 80 | Ht 64.0 in | Wt 208.0 lb

## 2016-02-14 DIAGNOSIS — N3091 Cystitis, unspecified with hematuria: Secondary | ICD-10-CM | POA: Insufficient documentation

## 2016-02-14 DIAGNOSIS — R300 Dysuria: Secondary | ICD-10-CM | POA: Diagnosis not present

## 2016-02-14 DIAGNOSIS — IMO0001 Reserved for inherently not codable concepts without codable children: Secondary | ICD-10-CM | POA: Insufficient documentation

## 2016-02-14 LAB — POCT URINALYSIS DIPSTICK
Bilirubin, UA: NEGATIVE
GLUCOSE UA: NEGATIVE
Ketones, UA: NEGATIVE
NITRITE UA: POSITIVE
Protein, UA: NEGATIVE
Spec Grav, UA: <=1.005
UROBILINOGEN UA: 0.2
pH, UA: 6

## 2016-02-14 MED ORDER — SULFAMETHOXAZOLE-TRIMETHOPRIM 800-160 MG PO TABS: 1.0000 | ORAL_TABLET | Freq: Two times a day (BID) | ORAL | 0 refills | Status: DC

## 2016-02-14 NOTE — Progress Notes (Signed)
HPI: Kathy Kaiser is a 40 y.o. female  who presents to [place] today, 02/14/16,  for chief complaint of:  Chief Complaint  Patient presents with  . BURN ON URINATION    . Quality: Dysuria and frequency . Duration: One day . Modifying factors: Last UTI was several years ago . Assoc signs/symptoms: No fever chills or abdominal pain  Records reviewed, no previous urine culture results     Past medical, surgical, social and family history reviewed: Past Medical History:  Diagnosis Date  . Asthma    mild intermittent  . GERD (gastroesophageal reflux disease)    Past Surgical History:  Procedure Laterality Date  . APPENDECTOMY  1989  . CESAREAN SECTION  06/2005    06/2009   Social History  Substance Use Topics  . Smoking status: Never Smoker  . Smokeless tobacco: Not on file  . Alcohol use Yes     Comment: rarely   Family History  Problem Relation Age of Onset  . GER disease Mother   . Thyroid disease Mother   . Hypertension Father   . Thyroid disease Sister   . Diabetes Sister     Gestational  . Diabetes type II Father   . Heart disease       Current medication list and allergy/intolerance information reviewed:   Current Outpatient Prescriptions  Medication Sig Dispense Refill  . albuterol (PROVENTIL HFA;VENTOLIN HFA) 108 (90 Base) MCG/ACT inhaler Inhale 2 puffs into the lungs every 6 (six) hours as needed for wheezing or shortness of breath. 18 g 3  . beclomethasone (QVAR) 40 MCG/ACT inhaler Inhale 2 puffs into the lungs 2 (two) times daily. 1 Inhaler 6  . cetirizine (ZYRTEC) 10 MG tablet Take 10 mg by mouth daily.    . meclizine (ANTIVERT) 25 MG tablet Take 1 tablet (25 mg total) by mouth 3 (three) times daily as needed for dizziness. 30 tablet 1   No current facility-administered medications for this visit.    Allergies  Allergen Reactions  . Hydrocodone Hives and Nausea And Vomiting  . Budesonide Other (See  Comments)    Throat irritation.   . Clonazepam Other (See Comments)    Hallucinations   . Codeine Nausea And Vomiting  . Oxycodone Hcl Hives      Review of Systems:  Constitutional:  No  fever, no chills,  Cardiac: No  chest pain  Gastrointestinal: No  abdominal pain, No  nausea, No  vomiting,   Genitourinary: No  incontinence, No  abnormal genital bleeding, No abnormal genital discharge  Skin: No  Rash   Exam:  BP 107/72   Pulse 80   Ht 5\' 4"  (1.626 m)   Wt 208 lb (94.3 kg)   BMI 35.70 kg/m   Constitutional: VS see above. General Appearance: alert, well-developed, well-nourished, NAD  Respiratory: Normal respiratory effort. no wheeze, no rhonchi, no rales  Cardiovascular: S1/S2 normal, no murmur, no rub/gallop auscultated. RRR.   Gastrointestinal: Nontender, no masses.  Musculoskeletal: Gait normal. [place] negative bilaterally.     Results for orders placed or performed in visit on 02/14/16 (from the past 72 hour(s))  POCT Urinalysis Dipstick     Status: Abnormal   Collection Time: 02/14/16  8:21 AM  Result Value Ref Range   Color, UA YELLOW    Clarity, UA CLEAR    Glucose, UA NEGATIVE    Bilirubin, UA NEGATIVE    Ketones, UA NEGATIVE    Spec Grav, UA <=1.005  Blood, UA LARGE    pH, UA 6.0    Protein, UA NEGATIVE    Urobilinogen, UA 0.2    Nitrite, UA POSITIVE    Leukocytes, UA small (1+) (A) Negative     ASSESSMENT/PLAN:   Cystitis with hematuria - Plan: sulfamethoxazole-trimethoprim (BACTRIM DS) 800-160 MG tablet  Dysuria - Plan: POCT Urinalysis Dipstick, Urine Culture    All questions at time of visit were answered - patient instructed to contact office with any additional concerns. ER/RTC precautions were reviewed with the patient. Follow-up plan: No Follow-up on file.

## 2016-02-16 LAB — URINE CULTURE

## 2016-08-16 ENCOUNTER — Ambulatory Visit (INDEPENDENT_AMBULATORY_CARE_PROVIDER_SITE_OTHER): Payer: PRIVATE HEALTH INSURANCE | Admitting: Family Medicine

## 2016-08-16 VITALS — BP 116/81 | HR 80 | Temp 97.4°F | Wt 207.0 lb

## 2016-08-16 DIAGNOSIS — R319 Hematuria, unspecified: Secondary | ICD-10-CM

## 2016-08-16 LAB — POCT URINALYSIS DIPSTICK
BILIRUBIN UA: NEGATIVE
GLUCOSE UA: NEGATIVE
Nitrite, UA: NEGATIVE
PH UA: 6
Protein, UA: NEGATIVE
SPEC GRAV UA: 1.01
Urobilinogen, UA: 0.2

## 2016-08-16 MED ORDER — CIPROFLOXACIN HCL 500 MG PO TABS: 500.0000 mg | ORAL_TABLET | Freq: Two times a day (BID) | ORAL | 0 refills | Status: AC

## 2016-08-16 NOTE — Progress Notes (Signed)
   Subjective:    Patient ID: Kathy Kaiser, female    DOB: [birth year], 41 y.o.   MRN: [id number]  HPI 56 female comes in Today complaining of blood in her urine. She said she had a similar episode back in August. Both times she was constipated and strained and within a couple hours after bowel movement noticed that she was passing clots of blood in the urine. She denies any vaginal pain or discharge but has had some perineal discomfort. Sweats. No back pain.   Review of Systems     Objective:   Physical Exam  Constitutional: She is oriented to person, place, and time. She appears well-developed and well-nourished.  HENT:  Head: Normocephalic and atraumatic.  Eyes: Conjunctivae and EOM are normal.  Cardiovascular: Normal rate.   Pulmonary/Chest: Effort normal.  Neurological: She is alert and oriented to person, place, and time.  Skin: Skin is dry. No pallor.  Psychiatric: She has a normal mood and affect. Her behavior is normal.  Vitals reviewed.         Assessment & Plan:  Hematuria - Urinalysis confirmed hematuria. We'll send for microscopic review as well as culture. I did give her perception for Cipro to fell over the weekend if her symptoms persist or get worse.. Plan to repeat urinalysis with microscopic review and 7-10 days to make sure that the blood is not persistent.  Constipation-encouraged her to take a softener or laxative to move her bowels and then come back in one week to repeat urineArea that could be that she has more of a perineal tear that's causing bleeding versus actually coming from her urethra or bladder.

## 2016-08-17 ENCOUNTER — Encounter: Payer: Self-pay | Admitting: Family Medicine

## 2016-08-17 LAB — URINALYSIS, MICROSCOPIC ONLY
Bacteria, UA: NONE SEEN [HPF]
CASTS: NONE SEEN [LPF]
Crystals: NONE SEEN [HPF]
RBC / HPF: NONE SEEN RBC/HPF (ref <=2)
SQUAMOUS EPITHELIAL / LPF: NONE SEEN [HPF] (ref <=5)
Yeast: NONE SEEN [HPF]

## 2016-08-18 LAB — URINE CULTURE

## 2016-08-26 ENCOUNTER — Other Ambulatory Visit: Payer: Self-pay | Admitting: Family Medicine

## 2016-08-26 DIAGNOSIS — R300 Dysuria: Secondary | ICD-10-CM

## 2016-08-26 LAB — URINALYSIS, ROUTINE W REFLEX MICROSCOPIC
Bilirubin Urine: NEGATIVE
GLUCOSE, UA: NEGATIVE
Hgb urine dipstick: NEGATIVE
Ketones, ur: NEGATIVE
Leukocytes, UA: NEGATIVE
Nitrite: NEGATIVE
PH: 6 (ref 5.0–8.0)
Protein, ur: NEGATIVE
SPECIFIC GRAVITY, URINE: 1.004 (ref 1.001–1.035)

## 2016-08-28 LAB — URINE CULTURE: Organism ID, Bacteria: NO GROWTH

## 2016-09-05 ENCOUNTER — Emergency Department (INDEPENDENT_AMBULATORY_CARE_PROVIDER_SITE_OTHER): Payer: PRIVATE HEALTH INSURANCE

## 2016-09-05 ENCOUNTER — Encounter: Payer: Self-pay | Admitting: Emergency Medicine

## 2016-09-05 ENCOUNTER — Emergency Department (INDEPENDENT_AMBULATORY_CARE_PROVIDER_SITE_OTHER)
Admission: EM | Admit: 2016-09-05 | Discharge: 2016-09-05 | Disposition: A | Discharge status: Home / Self Care | Payer: PRIVATE HEALTH INSURANCE | Source: Home / Self Care | Attending: Family Medicine | Admitting: Family Medicine

## 2016-09-05 DIAGNOSIS — K828 Other specified diseases of gallbladder: Secondary | ICD-10-CM

## 2016-09-05 DIAGNOSIS — K8080 Other cholelithiasis without obstruction: Secondary | ICD-10-CM

## 2016-09-05 DIAGNOSIS — K8020 Calculus of gallbladder without cholecystitis without obstruction: Secondary | ICD-10-CM

## 2016-09-05 DIAGNOSIS — R1013 Epigastric pain: Secondary | ICD-10-CM

## 2016-09-05 DIAGNOSIS — R1011 Right upper quadrant pain: Secondary | ICD-10-CM

## 2016-09-05 HISTORY — DX: Disorder of kidney and ureter, unspecified: N28.9

## 2016-09-05 HISTORY — DX: Calculus of kidney: N20.0

## 2016-09-05 LAB — POCT CBC W AUTO DIFF (K'VILLE URGENT CARE)

## 2016-09-05 MED ORDER — ACETAMINOPHEN 325 MG PO TABS
650.0000 mg | ORAL_TABLET | Freq: Once | ORAL | Status: AC
  Administered 2016-09-05: 650 mg via ORAL

## 2016-09-05 NOTE — ED Provider Notes (Signed)
CSN: [id number]     Arrival date & time 09/05/16  1439 History   First MD Initiated Contact with Patient 09/05/16 1459     Chief Complaint  Patient presents with  . Abdominal Pain   (Consider location/radiation/quality/duration/timing/severity/associated sxs/prior Treatment) HPI Kathy Kaiser is a 41 y.o. female presenting to UC with c/o sudden onset upper abdominal pain in epigastrium and RUQ.  Pain is dull and aching, radiating to her back.  Pain is 6/10 at worst. Pain started 2 hours ago and has gradually worsened.  She recalls this happening about 2 weeks ago after drinking coffee with creamer but pain resolved on its own within about 45 minutes.  She also developed similar pain a few months ago and thinks it was after drinking coffee with a creamer as well but does notes she did not drink coffee today before symptoms started. She is unsure what caused current pain.  Denies fever, chills, n/v/d.  Menses is due this week, LMP 08/02/16. Denies concern for pregnancy. Denies urinary symptoms or vaginal symptoms.    Past Medical History:  Diagnosis Date  . Asthma    mild intermittent  . GERD (gastroesophageal reflux disease)   . Renal calculi   . Renal disorder    Past Surgical History:  Procedure Laterality Date  . APPENDECTOMY  1989  . CESAREAN SECTION  06/2005    06/2009  . KNEE SURGERY     Family History  Problem Relation Age of Onset  . GER disease Mother   . Thyroid disease Mother   . Hypertension Father   . Diabetes type II Father   . Thyroid disease Sister   . Diabetes Sister     Gestational  . Heart disease     Social History  Substance Use Topics  . Smoking status: Never Smoker  . Smokeless tobacco: Never Used  . Alcohol use Yes     Comment: rarely   OB History    No data available     Review of Systems  Constitutional: Negative for chills and fever.  Gastrointestinal: Positive for abdominal pain. Negative for diarrhea, nausea and vomiting.  Genitourinary:  Negative for decreased urine volume, dysuria, flank pain, hematuria, pelvic pain, urgency, vaginal bleeding, vaginal discharge and vaginal pain.  Musculoskeletal: Positive for back pain. Negative for myalgias.    Allergies  Hydrocodone; Budesonide; Clonazepam; Codeine; and Oxycodone hcl  Home Medications   Prior to Admission medications   Medication Sig Start Date End Date Taking? Authorizing Provider  albuterol (PROVENTIL HFA;VENTOLIN HFA) 108 (90 Base) MCG/ACT inhaler Inhale 2 puffs into the lungs every 6 (six) hours as needed for wheezing or shortness of breath. 11/23/15   Hali Marry, MD  beclomethasone (QVAR) 40 MCG/ACT inhaler Inhale 2 puffs into the lungs 2 (two) times daily. 11/23/15   Hali Marry, MD  cetirizine (ZYRTEC) 10 MG tablet Take 10 mg by mouth daily.    Historical Provider, MD  meclizine (ANTIVERT) 25 MG tablet Take 1 tablet (25 mg total) by mouth 3 (three) times daily as needed for dizziness. 11/23/15   Hali Marry, MD   Meds Ordered and Administered this Visit   Medications  acetaminophen (TYLENOL) tablet 650 mg (650 mg Oral Given 09/05/16 1507)    BP 119/81 (BP Location: Left Arm)   Pulse 93   Temp 97.8 F (36.6 C) (Oral)   Resp 18   Ht 5\' 4"  (1.626 m)   Wt 206 lb (93.4 kg)   LMP 08/02/2016 (  Exact Date)   SpO2 99%   BMI 35.36 kg/m  No data found.   Physical Exam  Constitutional: She is oriented to person, place, and time. She appears well-developed and well-nourished.  Pt sitting on exam bed, bending over holding her upper abdomen. Appears uncomfortable.   HENT:  Head: Normocephalic and atraumatic.  Eyes: EOM are normal.  Neck: Normal range of motion. Neck supple.  Cardiovascular: Normal rate and regular rhythm.   Pulmonary/Chest: Effort normal and breath sounds normal. No respiratory distress. She has no wheezes. She has no rales.  Abdominal: Soft. She exhibits no distension and no mass. There is tenderness. There is no  rebound, no guarding and no CVA tenderness.  Musculoskeletal: Normal range of motion.  Neurological: She is alert and oriented to person, place, and time.  Skin: Skin is warm and dry. She is not diaphoretic.  Psychiatric: She has a normal mood and affect. Her behavior is normal.  Nursing note and vitals reviewed.   Urgent Care Course     Procedures (including critical care time)  Labs Review Labs Reviewed  COMPLETE METABOLIC PANEL WITH GFR  POCT CBC W AUTO DIFF ([facility])    Imaging Review US Abdomen Limited Ruq  Result Date: 09/05/2016 CLINICAL DATA:  Severe epigastric and right upper quadrant pain today. EXAM: US ABDOMEN LIMITED - RIGHT UPPER QUADRANT COMPARISON:  None. FINDINGS: Gallbladder: Multiple echogenic foci and ring down artifact from the anterior gallbladder wall compatible with adenomyomatosis. 7 mm gallstone noted within the region of the gallbladder neck. No wall thickening. The patient was tender over the gallbladder during the study. Gallbladder is mildly distended. Common bile duct: Diameter: Normal caliber, 1 mm Liver: No focal lesion identified. Within normal limits in parenchymal echogenicity. IMPRESSION: Mild gallbladder distention. 7 mm gallstone noted in the region of the gallbladder neck. The patient was tender over the gallbladder during the study. Changes of adenomyomatosis. Electronically Signed   By: Rolm Baptise M.D.   On: 09/05/2016 15:57     MDM   1. Calculus of gallbladder without cholecystitis without obstruction   2. Epigastric abdominal pain   3. RUQ abdominal pain    Hx and exam c/w cholelithiasis w/o evidence of cholecystitis.   650mg  acetaminophen given in UC, pain did improve slightly. Pt declined Toradol or nausea medication.  Discussed imaging with pt. Encouraged f/u with PCP as well as general surgery for elective surgery. Pt declined prescription pain medication and is not having any related nausea.  Discussed symptoms that  warrant emergent care in the ED. Patient verbalized understanding and agreement with treatment plan.     N[place]and Fordyce, PA-C 09/05/16 [place], PA-C 09/05/16 1620

## 2016-09-05 NOTE — ED Triage Notes (Signed)
Reports abdominal pain for past 2 hours. Denies nausea, vomiting, diarrhea. Menses due this week; states no way she could be pregnant. Has had kidney stone, but this is dissimilar.

## 2016-09-05 NOTE — ED Notes (Signed)
To US

## 2016-09-06 ENCOUNTER — Ambulatory Visit (INDEPENDENT_AMBULATORY_CARE_PROVIDER_SITE_OTHER): Payer: PRIVATE HEALTH INSURANCE | Admitting: Family Medicine

## 2016-09-06 ENCOUNTER — Encounter: Payer: Self-pay | Admitting: Family Medicine

## 2016-09-06 ENCOUNTER — Telehealth: Payer: Self-pay | Admitting: Emergency Medicine

## 2016-09-06 VITALS — BP 110/65 | HR 72 | Ht 64.0 in | Wt 210.0 lb

## 2016-09-06 DIAGNOSIS — K8001 Calculus of gallbladder with acute cholecystitis with obstruction: Secondary | ICD-10-CM

## 2016-09-06 LAB — COMPLETE METABOLIC PANEL WITH GFR
ALT: 17 U/L (ref 6–29)
AST: 14 U/L (ref 10–30)
Albumin: 4.4 g/dL (ref 3.6–5.1)
Alkaline Phosphatase: 31 U/L — ABNORMAL LOW (ref 33–115)
BUN: 11 mg/dL (ref 7–25)
CO2: 27 mmol/L (ref 20–31)
Calcium: 9.2 mg/dL (ref 8.6–10.2)
Chloride: 105 mmol/L (ref 98–110)
Creat: 0.78 mg/dL (ref 0.50–1.10)
GFR, Est African American: >89 mL/min (ref >=60)
GFR, Est Non African American: >89 mL/min (ref >=60)
Glucose, Bld: 91 mg/dL (ref 65–99)
Potassium: 4.5 mmol/L (ref 3.5–5.3)
Sodium: 140 mmol/L (ref 135–146)
Total Bilirubin: 0.4 mg/dL (ref 0.2–1.2)
Total Protein: 7.4 g/dL (ref 6.1–8.1)

## 2016-09-06 MED ORDER — ALBUTEROL SULFATE HFA 108 (90 BASE) MCG/ACT IN AERS: 2.0000 | INHALATION_SPRAY | Freq: Four times a day (QID) | RESPIRATORY_TRACT | 3 refills | Status: DC | PRN

## 2016-09-06 NOTE — Telephone Encounter (Signed)
Labs are normal, follow up with Dr Eustaquio Boyden

## 2016-09-06 NOTE — Progress Notes (Signed)
Subjective:    Patient ID: Kathy Kaiser, female    DOB: February 09, 1976, 40 y.o.   MRN: [id number]  HPI 41 yo female comes in today follow-up for urgent care visit. She actually went to urgent care yesterday for sudden onset of epigastric and right upper quadrant pain. She actually is feeling a little better today but has also been eating a bland diet. She's not had any fevers chills or sweats. No recent changes in bowel movement. She says she had actually had similar episodes twice before but they were much more mild. Yesterday after lasting about 4 hours she had finally decided to go to urgent care. No vomiting.  Abdominal ultrasound showed:  IMPRESSION: Mild gallbladder distention. 7 mm gallstone noted in the region of the gallbladder neck. The patient was tender over the gallbladder during the study.   Review of Systems   BP 110/65   Pulse 72   Ht 5\' 4"  (1.626 m)   Wt 210 lb (95.3 kg)   SpO2 100%   BMI 36.05 kg/m     Allergies  Allergen Reactions  . Hydrocodone Hives and Nausea And Vomiting  . Budesonide Other (See Comments)    Throat irritation.   . Clonazepam Other (See Comments)    Hallucinations   . Codeine Nausea And Vomiting  . Oxycodone Hcl Hives    Past Medical History:  Diagnosis Date  . Asthma    mild intermittent  . GERD (gastroesophageal reflux disease)   . Renal calculi   . Renal disorder     Past Surgical History:  Procedure Laterality Date  . APPENDECTOMY  1989  . CESAREAN SECTION  06/2005    06/2009  . KNEE SURGERY      Social History   Social History  . Marital status: Married    Spouse name: Corene Cornea  . Number of children: 2  . Years of education: N/A   Occupational History  . Case Manager    Social History Main Topics  . Smoking status: Never Smoker  . Smokeless tobacco: Never Used  . Alcohol use Yes     Comment: rarely  . Drug use: No  . Sexual activity: Yes    Partners: Male   Other Topics Concern  . Not on file   Social  History Narrative   Occ exercise 3-4 days per week. . Daily caffeine 1-2 per day.     Family History  Problem Relation Age of Onset  . GER disease Mother   . Thyroid disease Mother   . Hypertension Father   . Diabetes type II Father   . Thyroid disease Sister   . Diabetes Sister     Gestational  . Heart disease      Outpatient Encounter Prescriptions as of 09/06/2016  Medication Sig  . albuterol (PROVENTIL HFA;VENTOLIN HFA) 108 (90 Base) MCG/ACT inhaler Inhale 2 puffs into the lungs every 6 (six) hours as needed for wheezing or shortness of breath.  . beclomethasone (QVAR) 40 MCG/ACT inhaler Inhale 2 puffs into the lungs 2 (two) times daily.  . cetirizine (ZYRTEC) 10 MG tablet Take 10 mg by mouth daily.  . meclizine (ANTIVERT) 25 MG tablet Take 1 tablet (25 mg total) by mouth 3 (three) times daily as needed for dizziness.  . [DISCONTINUED] albuterol (PROVENTIL HFA;VENTOLIN HFA) 108 (90 Base) MCG/ACT inhaler Inhale 2 puffs into the lungs every 6 (six) hours as needed for wheezing or shortness of breath.   No facility-administered encounter medications on file  as of 09/06/2016.           Objective:   Physical Exam  Constitutional: She is oriented to person, place, and time. She appears well-developed and well-nourished.  HENT:  Head: Normocephalic and atraumatic.  Cardiovascular: Normal rate, regular rhythm and normal heart sounds.   Pulmonary/Chest: Effort normal and breath sounds normal.  Abdominal: Soft. Bowel sounds are normal. She exhibits no distension and no mass. There is tenderness. There is no rebound and no guarding.    Neurological: She is alert and oriented to person, place, and time.  Skin: Skin is warm and dry.  Psychiatric: She has a normal mood and affect. Her behavior is normal.       Assessment & Plan:  Acute cholecystitis-Discussed continuing with bland diet to keep the gallbladder less inflamed and irritated. Today she is actually pain-free and doing  well though she was tender on exam. We'll go ahead and place referral to general surgery for consultation evaluation for possible cholecystectomy.

## 2016-09-13 DIAGNOSIS — K8020 Calculus of gallbladder without cholecystitis without obstruction: Secondary | ICD-10-CM | POA: Insufficient documentation

## 2016-09-30 ENCOUNTER — Encounter: Payer: Self-pay | Admitting: Physician Assistant

## 2016-09-30 ENCOUNTER — Ambulatory Visit (INDEPENDENT_AMBULATORY_CARE_PROVIDER_SITE_OTHER): Payer: PRIVATE HEALTH INSURANCE | Admitting: Physician Assistant

## 2016-09-30 VITALS — BP 135/83 | HR 77 | Temp 97.5°F | Ht 64.0 in | Wt 209.0 lb

## 2016-09-30 DIAGNOSIS — R3915 Urgency of urination: Secondary | ICD-10-CM | POA: Diagnosis not present

## 2016-09-30 DIAGNOSIS — R300 Dysuria: Secondary | ICD-10-CM | POA: Diagnosis not present

## 2016-09-30 LAB — POCT URINALYSIS DIPSTICK
BILIRUBIN UA: NEGATIVE
Blood, UA: NEGATIVE
GLUCOSE UA: NEGATIVE
KETONES UA: NEGATIVE
LEUKOCYTES UA: NEGATIVE
Nitrite, UA: NEGATIVE
PH UA: 6 (ref 5.0–8.0)
PROTEIN UA: NEGATIVE
SPEC GRAV UA: 1.005 (ref 1.030–1.035)
Urobilinogen, UA: 0.2 (ref ?–2.0)

## 2016-09-30 MED ORDER — PHENAZOPYRIDINE HCL 200 MG PO TABS: 200.0000 mg | ORAL_TABLET | Freq: Three times a day (TID) | ORAL | 0 refills | Status: AC

## 2016-09-30 MED ORDER — NITROFURANTOIN MONOHYD MACRO 100 MG PO CAPS: 100.0000 mg | ORAL_CAPSULE | Freq: Two times a day (BID) | ORAL | 0 refills | Status: DC

## 2016-09-30 MED ORDER — PHENAZOPYRIDINE HCL 200 MG PO TABS: 200.0000 mg | ORAL_TABLET | Freq: Three times a day (TID) | ORAL | 0 refills | Status: DC

## 2016-09-30 NOTE — Progress Notes (Signed)
   Subjective:    Patient ID: Kathy Kaiser, female    DOB: 07-11-1975, 41 y.o.   MRN: [id number]  HPI  Pt is a 41 yo female who presents to the clinic with 5 days of urinary urgency, frequency and dysuria off and on. She has a recent history of UTI's with most recent on 08/16/16 and then found to be cleared on 08/26/16. She did have her gallbladder out on 09/23/16 since then it has been harder to urinate and stools have been loose. She was under anesthesia for surgery but no cather was placed. She denies fever, chills, flank pain. She has not tried anything to make better.   Review of Systems    see HPI.  Objective:   Physical Exam  Constitutional: She is oriented to person, place, and time. She appears well-developed and well-nourished.  HENT:  Head: Normocephalic and atraumatic.  Cardiovascular: Normal rate, regular rhythm and normal heart sounds.   Pulmonary/Chest: Effort normal and breath sounds normal.  No CVA tenderness.   Abdominal: Soft. Bowel sounds are normal.  Diffuse tenderness over entire abdomen likely due to recent cholecystectomy.  No guarding or rebound.  No suprapubic tenderness.   Neurological: She is alert and oriented to person, place, and time.  Psychiatric: She has a normal mood and affect. Her behavior is normal.          Assessment & Plan:  .Marland KitchenAubriegh was seen today for urinary urgency and dysuria.  Diagnoses and all orders for this visit:  Urinary urgency -     POCT urinalysis dipstick -     Urine Culture -     nitrofurantoin, macrocrystal-monohydrate, (MACROBID) 100 MG capsule; Take 1 capsule (100 mg total) by mouth 2 (two) times daily. For 7 days.  Dysuria -     POCT urinalysis dipstick -     Urine Culture -     nitrofurantoin, macrocrystal-monohydrate, (MACROBID) 100 MG capsule; Take 1 capsule (100 mg total) by mouth 2 (two) times daily. For 7 days. -     phenazopyridine (PYRIDIUM) 200 MG tablet; Take 1 tablet (200 mg total) by mouth 3 (three) times  daily.  Other orders -     Discontinue: phenazopyridine (PYRIDIUM) 200 MG tablet; Take 1 tablet (200 mg total) by mouth 3 (three) times daily. -     Discontinue: nitrofurantoin, macrocrystal-monohydrate, (MACROBID) 100 MG capsule; Take 1 capsule (100 mg total) by mouth 2 (two) times daily. For 7 days.   .. Results for orders placed or performed in visit on 09/30/16  POCT urinalysis dipstick  Result Value Ref Range   Color, UA light yellow    Clarity, UA clear    Glucose, UA neg    Bilirubin, UA neg    Ketones, UA neg    Spec Grav, UA 1.005 1.030 - 1.035   Blood, UA neg    pH, UA 6.0 5.0 - 8.0   Protein, UA neg    Urobilinogen, UA 0.2 Negative - 2.0   Nitrite, UA neg    Leukocytes, UA Negative Negative   Will culture.  Hold macrobid.  Start pyridium for inflammation. Stay hydrated. If UTI symptoms worsens or develops a fever before culture results start abx.  If culture negative and still having symptoms will consider treating for bladder spasms.

## 2016-10-01 ENCOUNTER — Ambulatory Visit: Payer: Self-pay | Admitting: Family Medicine

## 2016-10-01 LAB — URINE CULTURE

## 2016-10-02 ENCOUNTER — Encounter: Payer: Self-pay | Admitting: Physician Assistant

## 2016-10-02 MED ORDER — OXYBUTYNIN CHLORIDE ER 5 MG PO TB24: 5.0000 mg | ORAL_TABLET | Freq: Every day | ORAL | 1 refills | Status: DC

## 2016-11-07 ENCOUNTER — Encounter: Payer: Self-pay | Admitting: Family Medicine

## 2016-11-07 DIAGNOSIS — Z0000 Encounter for general adult medical examination without abnormal findings: Secondary | ICD-10-CM

## 2016-11-11 ENCOUNTER — Encounter: Payer: Self-pay | Admitting: Family Medicine

## 2016-11-11 ENCOUNTER — Ambulatory Visit (INDEPENDENT_AMBULATORY_CARE_PROVIDER_SITE_OTHER): Payer: PRIVATE HEALTH INSURANCE | Admitting: Family Medicine

## 2016-11-11 VITALS — BP 121/76 | HR 109 | Ht 64.0 in | Wt 207.0 lb

## 2016-11-11 DIAGNOSIS — Z0000 Encounter for general adult medical examination without abnormal findings: Secondary | ICD-10-CM

## 2016-11-11 LAB — CBC WITH DIFFERENTIAL/PLATELET
BASOS ABS: 60 {cells}/uL (ref 0–200)
BASOS PCT: 1 %
EOS ABS: 120 {cells}/uL (ref 15–500)
Eosinophils Relative: 2 %
HEMATOCRIT: 42.2 % (ref 35.0–45.0)
Hemoglobin: 14.1 g/dL (ref 11.7–15.5)
LYMPHS PCT: 31 %
Lymphs Abs: 1860 cells/uL (ref 850–3900)
MCH: 31 pg (ref 27.0–33.0)
MCHC: 33.4 g/dL (ref 32.0–36.0)
MCV: 92.7 fL (ref 80.0–100.0)
MONO ABS: 360 {cells}/uL (ref 200–950)
MONOS PCT: 6 %
MPV: 10.5 fL (ref 7.5–12.5)
Neutro Abs: 3600 cells/uL (ref 1500–7800)
Neutrophils Relative %: 60 %
PLATELETS: 266 10*3/uL (ref 140–400)
RBC: 4.55 MIL/uL (ref 3.80–5.10)
RDW: 13.1 % (ref 11.0–15.0)
WBC: 6 10*3/uL (ref 3.8–10.8)

## 2016-11-11 LAB — TSH: TSH: 1.71 m[IU]/L

## 2016-11-11 NOTE — Progress Notes (Signed)
Subjective:     Kathy Kaiser is a 41 y.o. female and is here for a comprehensive physical exam. The patient reports no problems.She just a little frustrated because her weight has plateaued over the last 6 months. She's been exercising 6 days a week but really hasn't been able to lose. She is not tracking calories.  Social History   Social History  . Marital status: Married    Spouse name: Corene Cornea  . Number of children: 2  . Years of education: N/A   Occupational History  . Case Manager    Social History Main Topics  . Smoking status: Never Smoker  . Smokeless tobacco: Never Used  . Alcohol use Yes     Comment: rarely  . Drug use: No  . Sexual activity: Yes    Partners: Male   Other Topics Concern  . Not on file   Social History Narrative   Occ exercise 3-4 days per week. . Daily caffeine 1-2 per day.    Health Maintenance  Topic Date Due  . INFLUENZA VACCINE  02/05/2017  . PAP SMEAR  05/18/2018  . TETANUS/TDAP  04/07/2021  . HIV Screening  Completed    The following portions of the patient's history were reviewed and updated as appropriate: allergies, current medications, past family history, past medical history, past social history, past surgical history and problem list.  Review of Systems A comprehensive review of systems was negative.   Objective:    BP 121/76   Pulse (!) 109   Ht 5\' 4"  (1.626 m)   Wt 207 lb (93.9 kg)   SpO2 96%   BMI 35.53 kg/m  General appearance: alert, cooperative and appears stated age Head: Normocephalic, without obvious abnormality, atraumatic Eyes: conj clear, EOMI, PEERLA Ears: normal TM's and external ear canals both ears Nose: Nares normal. Septum midline. Mucosa normal. No drainage or sinus tenderness. Throat: lips, mucosa, and tongue normal; teeth and gums normal Neck: no adenopathy, no carotid bruit, no JVD, supple, symmetrical, trachea midline and thyroid not enlarged, symmetric, no tenderness/mass/nodules Back:  symmetric, no curvature. ROM normal. No CVA tenderness. Lungs: clear to auscultation bilaterally Breasts: normal appearance, no masses or tenderness Heart: regular rate and rhythm, S1, S2 normal, no murmur, click, rub or gallop Abdomen: soft, non-tender; bowel sounds normal; no masses,  no organomegaly Pelvic: deferred Extremities: extremities normal, atraumatic, no cyanosis or edema Pulses: 2+ and symmetric Skin: Skin color, texture, turgor normal. No rashes or lesions Lymph nodes: Cervical, supraclavicular, and axillary nodes normal. Neurologic: Alert and oriented X 3, normal strength and tone. Normal symmetric reflexes. Normal coordination and gait    Assessment:    Healthy female exam.      Plan:     See After Visit Summary for Counseling Recommendations    Keep up a regular exercise program and make sure you are eating a healthy diet Try to eat 4 servings of dairy a day, or if you are lactose intolerant take a calcium with vitamin D daily.  Your vaccines are up to date.   BMI 35-discussed strategies to get back on track. Recommend that she is a smart phone application such as my fitness pal to start tracking calories and see where she is at. Also encouraged her to really increase her protein intake. Recommend a trial of Premier shakes especially for breakfast which she often misses. Recommend a trial of trying to get up to 80 g of protein a day.

## 2016-11-12 LAB — COMPLETE METABOLIC PANEL WITH GFR
ALT: 13 U/L (ref 6–29)
AST: 14 U/L (ref 10–30)
Albumin: 4.2 g/dL (ref 3.6–5.1)
Alkaline Phosphatase: 32 U/L — ABNORMAL LOW (ref 33–115)
BILIRUBIN TOTAL: 0.6 mg/dL (ref 0.2–1.2)
BUN: 8 mg/dL (ref 7–25)
CALCIUM: 8.9 mg/dL (ref 8.6–10.2)
CHLORIDE: 106 mmol/L (ref 98–110)
CO2: 27 mmol/L (ref 20–31)
CREATININE: 0.8 mg/dL (ref 0.50–1.10)
Glucose, Bld: 95 mg/dL (ref 65–99)
Potassium: 4.1 mmol/L (ref 3.5–5.3)
Sodium: 140 mmol/L (ref 135–146)
TOTAL PROTEIN: 6.7 g/dL (ref 6.1–8.1)

## 2016-11-12 LAB — LIPID PANEL
CHOLESTEROL: 159 mg/dL (ref <200)
HDL: 39 mg/dL — ABNORMAL LOW (ref >50)
LDL Cholesterol: 98 mg/dL (ref <100)
Total CHOL/HDL Ratio: 4.1 Ratio (ref <5.0)
Triglycerides: 111 mg/dL (ref <150)
VLDL: 22 mg/dL (ref <30)

## 2016-11-17 ENCOUNTER — Encounter: Payer: Self-pay | Admitting: Family Medicine

## 2016-11-18 ENCOUNTER — Other Ambulatory Visit: Payer: Self-pay | Admitting: Family Medicine

## 2016-11-18 MED ORDER — PREDNISONE 20 MG PO TABS: 40.0000 mg | ORAL_TABLET | Freq: Every day | ORAL | 0 refills | Status: DC

## 2016-11-18 NOTE — Progress Notes (Signed)
Prednisone sent to pharmacy. See MyChart note.

## 2017-01-10 ENCOUNTER — Encounter: Payer: Self-pay | Admitting: Sports Medicine

## 2017-01-10 ENCOUNTER — Ambulatory Visit (INDEPENDENT_AMBULATORY_CARE_PROVIDER_SITE_OTHER): Payer: PRIVATE HEALTH INSURANCE | Admitting: Sports Medicine

## 2017-01-10 ENCOUNTER — Ambulatory Visit (INDEPENDENT_AMBULATORY_CARE_PROVIDER_SITE_OTHER): Payer: PRIVATE HEALTH INSURANCE

## 2017-01-10 DIAGNOSIS — M4317 Spondylolisthesis, lumbosacral region: Secondary | ICD-10-CM

## 2017-01-10 DIAGNOSIS — M5136 Other intervertebral disc degeneration, lumbar region: Secondary | ICD-10-CM | POA: Diagnosis not present

## 2017-01-10 DIAGNOSIS — M51369 Other intervertebral disc degeneration, lumbar region without mention of lumbar back pain or lower extremity pain: Secondary | ICD-10-CM

## 2017-01-10 MED ORDER — MELOXICAM 15 MG PO TABS: ORAL_TABLET | ORAL | 3 refills | Status: DC

## 2017-01-10 MED ORDER — PREDNISONE 10 MG (48) PO TBPK: ORAL_TABLET | Freq: Every day | ORAL | 0 refills | Status: DC

## 2017-01-10 NOTE — Assessment & Plan Note (Signed)
Questionable bilateral L5 pars defect on x-ray not seen on MRI. There is L4-5 and L5-S1 degenerative disc disease without foraminal stenosis. She is having new onset symptoms, bilateral paresthesias, unclear distribution. Flexion/extention x-rays, new MRI, aggressive formal physical therapy, prednisone taper, meloxicam. Return in one month to further evaluate.

## 2017-01-10 NOTE — Progress Notes (Signed)
   Subjective:    I'm seeing this patient as a consultation for:  Dr. Beatrice Lecher  CC: Low back pain  HPI: For many months this 41 year old female has had low back pain, initially she had some numbness from her waist down, MRI showed simple degenerative disc disease without any areas of central canal or foraminal impingement. This improved with some prednisone, unfortunately after running 5K she's having a recurrence of pain in her back, axial, discogenic with occasional radiation causing numbness and tingling in all toes of both feet. No bowel or bladder dysfunction, no saddle numbness, no constitutional symptoms. X-rays in the past did show questionable bilateral L5 pars defect without spondylolisthesis.  Past medical history:  Negative.  See flowsheet/record as well for more information.  Surgical history: Negative.  See flowsheet/record as well for more information.  Family history: Negative.  See flowsheet/record as well for more information.  Social history: Negative.  See flowsheet/record as well for more information.  Allergies, and medications have been entered into the medical record, reviewed, and no changes needed.   Review of Systems: No headache, visual changes, nausea, vomiting, diarrhea, constipation, dizziness, abdominal pain, skin rash, fevers, chills, night sweats, weight loss, swollen lymph nodes, body aches, joint swelling, muscle aches, chest pain, shortness of breath, mood changes, visual or auditory hallucinations.   Objective:   General: Well Developed, well nourished, and in no acute distress.  Neuro/Psych: Alert and oriented x3, extra-ocular muscles intact, able to move all 4 extremities, sensation grossly intact. Skin: Warm and dry, no rashes noted.  Respiratory: Not using accessory muscles, speaking in full sentences, trachea midline.  Cardiovascular: Pulses palpable, no extremity edema. Abdomen: Does not appear distended. Back Exam:  Inspection:  Unremarkable  Motion: Flexion 45 deg, Extension 45 deg, Side Bending to 45 deg bilaterally,  Rotation to 45 deg bilaterally  SLR laying: Negative  XSLR laying: Negative  Palpable tenderness: None. FABER: negative. Sensory change: Gross sensation intact to all lumbar and sacral dermatomes.  Reflexes: 2+ at both patellar tendons, 2+ at achilles tendons, Babinski's downgoing.  Strength at foot  Plantar-flexion: 5/5 Dorsi-flexion: 5/5 Eversion: 5/5 Inversion: 5/5  Leg strength  Quad: 5/5 Hamstring: 5/5 Hip flexor: 5/5 Hip abductors: 5/5  Gait unremarkable.  Impression and Recommendations:   This case required medical decision making of moderate complexity.  Lumbar degenerative disc disease Questionable bilateral L5 pars defect on x-ray not seen on MRI. There is L4-5 and L5-S1 degenerative disc disease without foraminal stenosis. She is having new onset symptoms, bilateral paresthesias, unclear distribution. Flexion/extention x-rays, new MRI, aggressive formal physical therapy, prednisone taper, meloxicam. Return in one month to further evaluate.

## 2017-01-11 ENCOUNTER — Encounter: Payer: Self-pay | Admitting: Sports Medicine

## 2017-01-13 ENCOUNTER — Telehealth: Payer: Self-pay | Admitting: Sports Medicine

## 2017-01-13 ENCOUNTER — Ambulatory Visit (INDEPENDENT_AMBULATORY_CARE_PROVIDER_SITE_OTHER): Payer: PRIVATE HEALTH INSURANCE

## 2017-01-13 DIAGNOSIS — M5117 Intervertebral disc disorders with radiculopathy, lumbosacral region: Secondary | ICD-10-CM

## 2017-01-13 DIAGNOSIS — M5116 Intervertebral disc disorders with radiculopathy, lumbar region: Secondary | ICD-10-CM

## 2017-01-13 DIAGNOSIS — M5136 Other intervertebral disc degeneration, lumbar region: Secondary | ICD-10-CM

## 2017-01-13 NOTE — Telephone Encounter (Signed)
Dara w/ [employer] called 01/13/17 @ 1:50pm  Authorization# [id number] Valid from 01/13/17-04/13/17

## 2017-01-16 ENCOUNTER — Encounter: Payer: Self-pay | Admitting: Sports Medicine

## 2017-02-07 ENCOUNTER — Encounter: Payer: Self-pay | Admitting: Sports Medicine

## 2017-02-07 ENCOUNTER — Ambulatory Visit (INDEPENDENT_AMBULATORY_CARE_PROVIDER_SITE_OTHER): Payer: PRIVATE HEALTH INSURANCE | Admitting: Sports Medicine

## 2017-02-07 DIAGNOSIS — M51369 Other intervertebral disc degeneration, lumbar region without mention of lumbar back pain or lower extremity pain: Secondary | ICD-10-CM

## 2017-02-07 DIAGNOSIS — M5136 Other intervertebral disc degeneration, lumbar region: Secondary | ICD-10-CM

## 2017-02-07 MED ORDER — DIAZEPAM 5 MG PO TABS: ORAL_TABLET | ORAL | 0 refills | Status: DC

## 2017-02-07 NOTE — Progress Notes (Signed)
  Subjective:    CC: Follow-up  HPI: This is a pleasant 41 year old female, seen her back for lumbar degenerative disc disease and back pain, with physical therapy her back pain has completely resolved, unfortunately she still does have some paresthesias in her feet, bilateral, symmetric. There is really no neck and to the lumbar spine in the MRI did not show central canal or foraminal stenosis to explain this. She doesn't have any other focal neurologic symptoms.  Past medical history:  Negative.  See flowsheet/record as well for more information.  Surgical history: Negative.  See flowsheet/record as well for more information.  Family history: Negative.  See flowsheet/record as well for more information.  Social history: Negative.  See flowsheet/record as well for more information.  Allergies, and medications have been entered into the medical record, reviewed, and no changes needed.   Review of Systems: No fevers, chills, night sweats, weight loss, chest pain, or shortness of breath.   Objective:    General: Well Developed, well nourished, and in no acute distress.  Neuro: Alert and oriented x3, extra-ocular muscles intact, sensation grossly intact.  HEENT: Normocephalic, atraumatic, pupils equal round reactive to light, neck supple, no masses, no lymphadenopathy, thyroid nonpalpable.  Skin: Warm and dry, no rashes. Cardiac: Regular rate and rhythm, no murmurs rubs or gallops, no lower extremity edema.  Respiratory: Clear to auscultation bilaterally. Not using accessory muscles, speaking in full sentences.  Impression and Recommendations:    Lumbar degenerative disc disease MRI shows lumbar degenerative disc disease at multiple levels but central canal and neural foramina are patent at all levels. The L5-S1 disc does come close to the L5 nerve roots in the extraforaminal location, I wonder if when she is upright, these nerve roots contacted the disc. Nonetheless because she is having  persistent paresthesias in her entire foot bilaterally not explained by MRI we are going to proceed with a nerve conduction study/EMG. Return to go over nerve conduction study results. Valium for sedation during procedure.  I spent 25 minutes with this patient, greater than 50% was face-to-face time counseling regarding the above diagnoses

## 2017-02-07 NOTE — Assessment & Plan Note (Addendum)
MRI shows lumbar degenerative disc disease at multiple levels but central canal and neural foramina are patent at all levels. The L5-S1 disc does come close to the L5 nerve roots in the extraforaminal location, I wonder if when she is upright, these nerve roots contacted the disc. Nonetheless because she is having persistent paresthesias in her entire foot bilaterally not explained by MRI we are going to proceed with a nerve conduction study/EMG. Return to go over nerve conduction study results. Valium for sedation during procedure.

## 2017-02-19 ENCOUNTER — Encounter: Payer: Self-pay | Admitting: Sports Medicine

## 2017-04-30 ENCOUNTER — Ambulatory Visit (INDEPENDENT_AMBULATORY_CARE_PROVIDER_SITE_OTHER): Payer: PRIVATE HEALTH INSURANCE | Admitting: Physician Assistant

## 2017-04-30 VITALS — BP 143/70 | HR 82 | Ht 64.02 in | Wt 212.0 lb

## 2017-04-30 DIAGNOSIS — J4521 Mild intermittent asthma with (acute) exacerbation: Secondary | ICD-10-CM

## 2017-04-30 DIAGNOSIS — J40 Bronchitis, not specified as acute or chronic: Secondary | ICD-10-CM

## 2017-04-30 DIAGNOSIS — J329 Chronic sinusitis, unspecified: Secondary | ICD-10-CM

## 2017-04-30 MED ORDER — BECLOMETHASONE DIPROPIONATE 40 MCG/ACT IN AERS: 2.0000 | INHALATION_SPRAY | Freq: Two times a day (BID) | RESPIRATORY_TRACT | 6 refills | Status: DC

## 2017-04-30 MED ORDER — METHYLPREDNISOLONE SODIUM SUCC 125 MG IJ SOLR
125.0000 mg | Freq: Once | INTRAMUSCULAR | Status: AC
  Administered 2017-04-30: 125 mg via INTRAMUSCULAR

## 2017-04-30 MED ORDER — AZITHROMYCIN 250 MG PO TABS: ORAL_TABLET | ORAL | 0 refills | Status: DC

## 2017-04-30 NOTE — Patient Instructions (Signed)

## 2017-05-01 ENCOUNTER — Encounter: Payer: Self-pay | Admitting: Physician Assistant

## 2017-05-01 NOTE — Progress Notes (Signed)
   Subjective:    Patient ID: Kathy Kaiser, female    DOB: 1975-11-22, 41 y.o.   MRN: [id number]  HPI Pt is a 41 yo pleasant female with hx of asthma who presents to the clinic with cough, SOB, chest tightness, headache, ear popping, sinus drainage for almost 2 weeks. She is taking nyquil which helps at night with cough and mucinex. She is having to use her rescue inhaler at lest twice a day. She admits to not taking qvar and running out. Sputum is green/brown. No fever, chills.   .. Active Ambulatory Problems    Diagnosis Date Noted  . Asthma 01/07/2008  . Overweight(278.02) 11/07/2011  . GAD (generalized anxiety disorder) 11/10/2013  . Lumbar degenerative disc disease 02/13/2015  . Contraception 02/14/2016  . Cystitis with hematuria 02/14/2016   Resolved Ambulatory Problems    Diagnosis Date Noted  . NEVUS, ATYPICAL 11/08/2009  . SINUSITIS- ACUTE-NOS 07/06/2008  . DYSPEPSIA 01/07/2008  . AMENORRHEA 02/01/2008  . RUQ PAIN 01/07/2008   Past Medical History:  Diagnosis Date  . Asthma   . GERD (gastroesophageal reflux disease)   . Renal calculi   . Renal disorder       Review of Systems See HPI.     Objective:   Physical Exam  Constitutional: She is oriented to person, place, and time. She appears well-developed and well-nourished.  HENT:  Head: Normocephalic and atraumatic.  Right Ear: External ear normal.  Left Ear: External ear normal.  TM"s erythematous with some dullness to TM bilaterally.  Oropharynx erythematous with PND.  Bilateral nasal turbinates red and swollen.  Tenderness over left maxillary sinus.   Eyes: Conjunctivae are normal. Right eye exhibits no discharge. Left eye exhibits no discharge.  Neck: Normal range of motion. Neck supple.  Cardiovascular: Normal rate, regular rhythm and normal heart sounds.   Pulmonary/Chest:  Cough continuously on exam. Expiratory wheezing heard at base bilaterally.   Lymphadenopathy:    She has cervical adenopathy.   Neurological: She is alert and oriented to person, place, and time.  Psychiatric: She has a normal mood and affect. Her behavior is normal.          Assessment & Plan:  .Marland KitchenIlma was seen today for cough.  Diagnoses and all orders for this visit:  Sinobronchitis -     azithromycin (ZITHROMAX) 250 MG tablet; Take 2 tablets now and then one tablet for 4 days. -     methylPREDNISolone sodium succinate (SOLU-MEDROL) 125 mg/2 mL injection 125 mg; Inject 2 mLs (125 mg total) into the muscle once.  Mild intermittent asthmatic bronchitis with acute exacerbation -     azithromycin (ZITHROMAX) 250 MG tablet; Take 2 tablets now and then one tablet for 4 days. -     beclomethasone (QVAR) 40 MCG/ACT inhaler; Inhale 2 puffs into the lungs 2 (two) times daily. -     methylPREDNISolone sodium succinate (SOLU-MEDROL) 125 mg/2 mL injection 125 mg; Inject 2 mLs (125 mg total) into the muscle once.   Restart qvar. rx given.  Solumedrol given IM today. Pt declined oral prednisone prescription.  zpak to start.  Use rescue inhaler as needed.  Follow up with [facility] regarding overall asthma control.

## 2017-10-12 ENCOUNTER — Encounter: Payer: Self-pay | Admitting: Family Medicine

## 2017-10-12 DIAGNOSIS — R202 Paresthesia of skin: Secondary | ICD-10-CM

## 2017-10-17 LAB — THYROID PEROXIDASE ANTIBODY: THYROID PEROXIDASE ANTIBODY: 2 [IU]/mL (ref <9)

## 2017-10-17 LAB — T4, FREE: Free T4: 1.1 ng/dL (ref 0.8–1.8)

## 2017-10-17 LAB — T3, FREE: T3, Free: 3.1 pg/mL (ref 2.3–4.2)

## 2017-10-17 LAB — TSH: TSH: 1.21 mIU/L

## 2017-12-19 ENCOUNTER — Telehealth: Payer: Self-pay | Admitting: Family Medicine

## 2017-12-19 NOTE — Telephone Encounter (Signed)
My chart note sent.

## 2018-01-02 ENCOUNTER — Telehealth: Payer: Self-pay | Admitting: Family Medicine

## 2018-01-02 NOTE — Telephone Encounter (Signed)
My Chart notes send regarding MS

## 2018-01-20 ENCOUNTER — Encounter: Payer: Self-pay | Admitting: Family Medicine

## 2018-01-20 DIAGNOSIS — Z0000 Encounter for general adult medical examination without abnormal findings: Secondary | ICD-10-CM

## 2018-01-21 NOTE — Telephone Encounter (Signed)
appt notes state annual exam. Dx code added and orders signed.

## 2018-01-21 NOTE — Telephone Encounter (Signed)
Labs placed are correct.  I was not sure which diagnoses code to use.  Was not sure if she was coming in for a physical or just a routine follow-up.

## 2018-01-23 LAB — COMPLETE METABOLIC PANEL WITH GFR
AG RATIO: 1.8 (calc) (ref 1.0–2.5)
ALKALINE PHOSPHATASE (APISO): 30 U/L — AB (ref 33–115)
ALT: 11 U/L (ref 6–29)
AST: 11 U/L (ref 10–30)
Albumin: 4.4 g/dL (ref 3.6–5.1)
BUN: 13 mg/dL (ref 7–25)
CHLORIDE: 107 mmol/L (ref 98–110)
CO2: 25 mmol/L (ref 20–32)
Calcium: 9.2 mg/dL (ref 8.6–10.2)
Creat: 0.8 mg/dL (ref 0.50–1.10)
GFR, EST AFRICAN AMERICAN: 108 mL/min/{1.73_m2} (ref >=60)
GFR, Est Non African American: 93 mL/min/{1.73_m2} (ref >=60)
GLUCOSE: 93 mg/dL (ref 65–99)
Globulin: 2.4 g/dL (calc) (ref 1.9–3.7)
POTASSIUM: 4.4 mmol/L (ref 3.5–5.3)
Sodium: 139 mmol/L (ref 135–146)
TOTAL PROTEIN: 6.8 g/dL (ref 6.1–8.1)
Total Bilirubin: 0.7 mg/dL (ref 0.2–1.2)

## 2018-01-23 LAB — LIPID PANEL
CHOL/HDL RATIO: 4.2 (calc) (ref <5.0)
Cholesterol: 168 mg/dL (ref <200)
HDL: 40 mg/dL — ABNORMAL LOW (ref >50)
LDL Cholesterol (Calc): 112 mg/dL (calc) — ABNORMAL HIGH
NON-HDL CHOLESTEROL (CALC): 128 mg/dL (ref <130)
Triglycerides: 71 mg/dL (ref <150)

## 2018-01-23 LAB — CBC
HCT: 43.1 % (ref 35.0–45.0)
Hemoglobin: 14.5 g/dL (ref 11.7–15.5)
MCH: 31 pg (ref 27.0–33.0)
MCHC: 33.6 g/dL (ref 32.0–36.0)
MCV: 92.3 fL (ref 80.0–100.0)
MPV: 11 fL (ref 7.5–12.5)
Platelets: 217 10*3/uL (ref 140–400)
RBC: 4.67 10*6/uL (ref 3.80–5.10)
RDW: 12.1 % (ref 11.0–15.0)
WBC: 7.2 10*3/uL (ref 3.8–10.8)

## 2018-01-26 ENCOUNTER — Encounter: Payer: PRIVATE HEALTH INSURANCE | Admitting: Family Medicine

## 2018-01-27 ENCOUNTER — Ambulatory Visit (INDEPENDENT_AMBULATORY_CARE_PROVIDER_SITE_OTHER): Payer: PRIVATE HEALTH INSURANCE | Admitting: Family Medicine

## 2018-01-27 ENCOUNTER — Encounter: Payer: Self-pay | Admitting: Family Medicine

## 2018-01-27 VITALS — BP 121/78 | HR 80 | Temp 97.9°F | Ht 64.0 in | Wt 199.6 lb

## 2018-01-27 DIAGNOSIS — Z0000 Encounter for general adult medical examination without abnormal findings: Secondary | ICD-10-CM

## 2018-01-27 DIAGNOSIS — G35 Multiple sclerosis: Secondary | ICD-10-CM | POA: Diagnosis not present

## 2018-01-27 NOTE — Progress Notes (Signed)
Subjective:     Kathy Kaiser is a 42 y.o. female and is here for a comprehensive physical exam. The patient reports problems - Recently diagnosed with MS..  She is opted to just do supplements for now and is meeting with a nutritionist soon.  She is holding off on prescription medications because of the side effects which include risks like increased risk for breast cancer.  She is actually been on the keto diet for the last 3 weeks and suspects that when her cholesterol was a little mildly elevated.  She is been taking vitamin D 5000 IU daily since April.  She is also using CBD oil to help her sleep and help her deal with her stress and says it has been helpful.  She is doing hot yoga twice a week in addition to swimming regularly.  Social History   Socioeconomic History  . Marital status: Married    Spouse name: Corene Cornea  . Number of children: 2  . Years of education: Not on file  . Highest education level: Not on file  Occupational History  . Occupation: Case Freight forwarder  Social Needs  . Financial resource strain: Not on file  . Food insecurity:    Worry: Not on file    Inability: Not on file  . Transportation needs:    Medical: Not on file    Non-medical: Not on file  Tobacco Use  . Smoking status: Never Smoker  . Smokeless tobacco: Never Used  Substance and Sexual Activity  . Alcohol use: Yes    Comment: rarely  . Drug use: No  . Sexual activity: Yes    Partners: Male  Lifestyle  . Physical activity:    Days per week: Not on file    Minutes per session: Not on file  . Stress: Not on file  Relationships  . Social connections:    Talks on phone: Not on file    Gets together: Not on file    Attends religious service: Not on file    Active member of club or organization: Not on file    Attends meetings of clubs or organizations: Not on file    Relationship status: Not on file  . Intimate partner violence:    Fear of current or ex partner: Not on file    Emotionally abused:  Not on file    Physically abused: Not on file    Forced sexual activity: Not on file  Other Topics Concern  . Not on file  Social History Narrative   Occ exercise 3-4 days per week. . Daily caffeine 1-2 per day.    Health Maintenance  Topic Date Due  . INFLUENZA VACCINE  02/05/2018  . PAP SMEAR  05/18/2018  . TETANUS/TDAP  04/07/2021  . HIV Screening  Completed    The following portions of the patient's history were reviewed and updated as appropriate: allergies, current medications, past family history, past medical history, past social history, past surgical history and problem list.  Review of Systems A comprehensive review of systems was negative.   Objective:    BP 121/78   Pulse 80   Temp 97.9 F (36.6 C) (Oral)   Ht 5\' 4"  (1.626 m)   Wt 199 lb 9.6 oz (90.5 kg)   SpO2 98%   BMI 34.26 kg/m  General appearance: alert, cooperative and appears stated age Head: Normocephalic, without obvious abnormality, atraumatic Eyes: conj clear, EOMI< PEERLA Ears: normal TM's and external ear canals both ears Nose:  Nares normal. Septum midline. Mucosa normal. No drainage or sinus tenderness. Throat: lips, mucosa, and tongue normal; teeth and gums normal Neck: no adenopathy, no carotid bruit, no JVD, supple, symmetrical, trachea midline and thyroid not enlarged, symmetric, no tenderness/mass/nodules Back: symmetric, no curvature. ROM normal. No CVA tenderness. Lungs: clear to auscultation bilaterally Breasts: normal appearance, no masses or tenderness Heart: regular rate and rhythm, S1, S2 normal, no murmur, click, rub or gallop Abdomen: soft, non-tender; bowel sounds normal; no masses,  no organomegaly Extremities: extremities normal, atraumatic, no cyanosis or edema Pulses: 2+ and symmetric Skin: Skin color, texture, turgor normal. No rashes or lesions    Assessment:    Healthy female exam.      Plan:     See After Visit Summary for Counseling Recommendations   Keep up a  regular exercise program and make sure you are eating a healthy diet Try to eat 4 servings of dairy a day, or if you are lactose intolerant take a calcium with vitamin D daily.  Your vaccines are up to date.  Discussed due for mammogram when she turns 42 later this year.    MS-has follow-up with neurology in the next couple of weeks and planning to rescan her in December.  Right now she is just going to take supplements and hold off on any prescription medication.

## 2018-01-27 NOTE — Patient Instructions (Signed)
Preventive Care 18-39 Years, Female Preventive care refers to lifestyle choices and visits with your health care provider that can promote health and wellness. What does preventive care include?  A yearly physical exam. This is also called an annual well check.  Dental exams once or twice a year.  Routine eye exams. Ask your health care provider how often you should have your eyes checked.  Personal lifestyle choices, including: ? Daily care of your teeth and gums. ? Regular physical activity. ? Eating a healthy diet. ? Avoiding tobacco and drug use. ? Limiting alcohol use. ? Practicing safe sex. ? Taking vitamin and mineral supplements as recommended by your health care provider. What happens during an annual well check? The services and screenings done by your health care provider during your annual well check will depend on your age, overall health, lifestyle risk factors, and family history of disease. Counseling Your health care provider may ask you questions about your:  Alcohol use.  Tobacco use.  Drug use.  Emotional well-being.  Home and relationship well-being.  Sexual activity.  Eating habits.  Work and work Statistician.  Method of birth control.  Menstrual cycle.  Pregnancy history.  Screening You may have the following tests or measurements:  Height, weight, and BMI.  Diabetes screening. This is done by checking your blood sugar (glucose) after you have not eaten for a while (fasting).  Blood pressure.  Lipid and cholesterol levels. These may be checked every 5 years starting at age 38.  Skin check.  Hepatitis C blood test.  Hepatitis B blood test.  Sexually transmitted disease (STD) testing.  BRCA-related cancer screening. This may be done if you have a family history of breast, ovarian, tubal, or peritoneal cancers.  Pelvic exam and Pap test. This may be done every 3 years starting at age 38. Starting at age 30, this may be done  every 5 years if you have a Pap test in combination with an HPV test.  Discuss your test results, treatment options, and if necessary, the need for more tests with your health care provider. Vaccines Your health care provider may recommend certain vaccines, such as:  Influenza vaccine. This is recommended every year.  Tetanus, diphtheria, and acellular pertussis (Tdap, Td) vaccine. You may need a Td booster every 10 years.  Varicella vaccine. You may need this if you have not been vaccinated.  HPV vaccine. If you are 39 or younger, you may need three doses over 6 months.  Measles, mumps, and rubella (MMR) vaccine. You may need at least one dose of MMR. You may also need a second dose.  Pneumococcal 13-valent conjugate (PCV13) vaccine. You may need this if you have certain conditions and were not previously vaccinated.  Pneumococcal polysaccharide (PPSV23) vaccine. You may need one or two doses if you smoke cigarettes or if you have certain conditions.  Meningococcal vaccine. One dose is recommended if you are age 68-21 years and a first-year college student living in a residence hall, or if you have one of several medical conditions. You may also need additional booster doses.  Hepatitis A vaccine. You may need this if you have certain conditions or if you travel or work in places where you may be exposed to hepatitis A.  Hepatitis B vaccine. You may need this if you have certain conditions or if you travel or work in places where you may be exposed to hepatitis B.  Haemophilus influenzae type b (Hib) vaccine. You may need this  if you have certain risk factors.  Talk to your health care provider about which screenings and vaccines you need and how often you need them. This information is not intended to replace advice given to you by your health care provider. Make sure you discuss any questions you have with your health care provider. Document Released: 08/20/2001 Document Revised:  03/13/2016 Document Reviewed: 04/25/2015 Elsevier Interactive Patient Education  2018 Elsevier Inc.  

## 2018-03-23 ENCOUNTER — Encounter: Payer: Self-pay | Admitting: Family Medicine

## 2018-03-25 ENCOUNTER — Ambulatory Visit (INDEPENDENT_AMBULATORY_CARE_PROVIDER_SITE_OTHER): Payer: PRIVATE HEALTH INSURANCE | Admitting: Physician Assistant

## 2018-03-25 DIAGNOSIS — Z23 Encounter for immunization: Secondary | ICD-10-CM

## 2018-03-25 NOTE — Progress Notes (Signed)
Pt here for flu shot. Afebrile,no recent illness. Vaccination given, pt tolerated well..Teran Knittle Lynetta, CMA  

## 2018-03-25 NOTE — Addendum Note (Signed)
Addended by: Teddy Spike on: 03/25/2018 02:57 PM   Modules accepted: Level of Service

## 2018-05-06 ENCOUNTER — Encounter: Payer: Self-pay | Admitting: Family Medicine

## 2018-05-07 MED ORDER — FLUTICASONE PROPIONATE 50 MCG/ACT NA SUSP: 1.0000 | Freq: Every day | NASAL | 6 refills | Status: DC

## 2018-12-25 ENCOUNTER — Encounter: Payer: Self-pay | Admitting: Family Medicine

## 2018-12-25 DIAGNOSIS — Z0000 Encounter for general adult medical examination without abnormal findings: Secondary | ICD-10-CM

## 2018-12-25 NOTE — Telephone Encounter (Addendum)
Labs pended. Please review and send pt MyChart reply when it is ok to get labs done

## 2018-12-28 NOTE — Telephone Encounter (Signed)
Perfect!! Thank yuou

## 2018-12-30 ENCOUNTER — Ambulatory Visit (INDEPENDENT_AMBULATORY_CARE_PROVIDER_SITE_OTHER): Payer: PRIVATE HEALTH INSURANCE

## 2018-12-30 ENCOUNTER — Encounter: Payer: Self-pay | Admitting: Family Medicine

## 2018-12-30 ENCOUNTER — Ambulatory Visit (INDEPENDENT_AMBULATORY_CARE_PROVIDER_SITE_OTHER): Payer: PRIVATE HEALTH INSURANCE | Admitting: Family Medicine

## 2018-12-30 ENCOUNTER — Other Ambulatory Visit: Payer: Self-pay

## 2018-12-30 VITALS — BP 109/65 | HR 90 | Ht 64.0 in | Wt 173.0 lb

## 2018-12-30 DIAGNOSIS — Z1231 Encounter for screening mammogram for malignant neoplasm of breast: Secondary | ICD-10-CM | POA: Diagnosis not present

## 2018-12-30 DIAGNOSIS — R200 Anesthesia of skin: Secondary | ICD-10-CM | POA: Insufficient documentation

## 2018-12-30 DIAGNOSIS — R202 Paresthesia of skin: Secondary | ICD-10-CM | POA: Insufficient documentation

## 2018-12-30 MED ORDER — ALBUTEROL SULFATE HFA 108 (90 BASE) MCG/ACT IN AERS: 2.0000 | INHALATION_SPRAY | Freq: Four times a day (QID) | RESPIRATORY_TRACT | 3 refills | Status: DC | PRN

## 2018-12-30 NOTE — Patient Instructions (Addendum)
Preventive Care 40-64 Years, Female Preventive care refers to lifestyle choices and visits with your health care provider that can promote health and wellness. What does preventive care include?   A yearly physical exam. This is also called an annual well check.  Dental exams once or twice a year.  Routine eye exams. Ask your health care provider how often you should have your eyes checked.  Personal lifestyle choices, including: ? Daily care of your teeth and gums. ? Regular physical activity. ? Eating a healthy diet. ? Avoiding tobacco and drug use. ? Limiting alcohol use. ? Practicing safe sex. ? Taking low-dose aspirin daily starting at age 50. ? Taking vitamin and mineral supplements as recommended by your health care provider. What happens during an annual well check? The services and screenings done by your health care provider during your annual well check will depend on your age, overall health, lifestyle risk factors, and family history of disease. Counseling Your health care provider may ask you questions about your:  Alcohol use.  Tobacco use.  Drug use.  Emotional well-being.  Home and relationship well-being.  Sexual activity.  Eating habits.  Work and work environment.  Method of birth control.  Menstrual cycle.  Pregnancy history. Screening You may have the following tests or measurements:  Height, weight, and BMI.  Blood pressure.  Lipid and cholesterol levels. These may be checked every 5 years, or more frequently if you are over 50 years old.  Skin check.  Lung cancer screening. You may have this screening every year starting at age 55 if you have a 30-pack-year history of smoking and currently smoke or have quit within the past 15 years.  Colorectal cancer screening. All adults should have this screening starting at age 50 and continuing until age 75. Your health care provider may recommend screening at age 45. You will have tests every  1-10 years, depending on your results and the type of screening test. People at increased risk should start screening at an earlier age. Screening tests may include: ? Guaiac-based fecal occult blood testing. ? Fecal immunochemical test (FIT). ? Stool DNA test. ? Virtual colonoscopy. ? Sigmoidoscopy. During this test, a flexible tube with a tiny camera (sigmoidoscope) is used to examine your rectum and lower colon. The sigmoidoscope is inserted through your anus into your rectum and lower colon. ? Colonoscopy. During this test, a long, thin, flexible tube with a tiny camera (colonoscope) is used to examine your entire colon and rectum.  Hepatitis C blood test.  Hepatitis B blood test.  Sexually transmitted disease (STD) testing.  Diabetes screening. This is done by checking your blood sugar (glucose) after you have not eaten for a while (fasting). You may have this done every 1-3 years.  Mammogram. This may be done every 1-2 years. Talk to your health care provider about when you should start having regular mammograms. This may depend on whether you have a family history of breast cancer.  BRCA-related cancer screening. This may be done if you have a family history of breast, ovarian, tubal, or peritoneal cancers.  Pelvic exam and Pap test. This may be done every 3 years starting at age 21. Starting at age 30, this may be done every 5 years if you have a Pap test in combination with an HPV test.  Bone density scan. This is done to screen for osteoporosis. You may have this scan if you are at high risk for osteoporosis. Discuss your test results, treatment options,   age 30, this may be done every 5 years if you have a Pap test in combination with an HPV test.   Bone density scan. This is done to screen for osteoporosis. You may have this scan if you are at high risk for osteoporosis.  Discuss your test results, treatment options, and if necessary, the need for more tests with your health care provider.  Vaccines  Your health care provider may recommend certain vaccines, such as:   Influenza vaccine. This is recommended every year.   Tetanus, diphtheria, and acellular pertussis (Tdap, Td) vaccine. You may need a Td booster every 10 years.   Varicella  vaccine. You may need this if you have not been vaccinated.   Zoster vaccine. You may need this after age 60.   Measles, mumps, and rubella (MMR) vaccine. You may need at least one dose of MMR if you were born in 1957 or later. You may also need a second dose.   Pneumococcal 13-valent conjugate (PCV13) vaccine. You may need this if you have certain conditions and were not previously vaccinated.   Pneumococcal polysaccharide (PPSV23) vaccine. You may need one or two doses if you smoke cigarettes or if you have certain conditions.   Meningococcal vaccine. You may need this if you have certain conditions.   Hepatitis A vaccine. You may need this if you have certain conditions or if you travel or work in places where you may be exposed to hepatitis A.   Hepatitis B vaccine. You may need this if you have certain conditions or if you travel or work in places where you may be exposed to hepatitis B.   Haemophilus influenzae type b (Hib) vaccine. You may need this if you have certain conditions.  Talk to your health care provider about which screenings and vaccines you need and how often you need them.  This information is not intended to replace advice given to you by your health care provider. Make sure you discuss any questions you have with your health care provider.  Document Released: 07/21/2015 Document Revised: 08/14/2017 Document Reviewed: 04/25/2015  Elsevier Interactive Patient Education  2019 Elsevier Inc.

## 2018-12-30 NOTE — Progress Notes (Signed)
Subjective:     Kathy Kaiser is a 43 y.o. female and is here for a comprehensive physical exam. The patient reports no problems.she is doing well. No on tecfidera for her MS. She has really changed her diet. Has been working with a holistic provider. Was on DHEA-S for about a month but now off.   Doing yoga twice a week and walking.  Usually swims but hasn't been able to do that with COVID.   Has been working from home.  She has been enjoying that.  She has a pink papular mole on the right lateral hip that she would like me look at today.  She says been there for a long time.  Has not really changed in size or shape.  Is not itchy and it has not been bleeding but just wanted me to take a look at it.  Social History   Socioeconomic History  . Marital status: Married    Spouse name: Corene Cornea  . Number of children: 2  . Years of education: Not on file  . Highest education level: Not on file  Occupational History  . Occupation: Case Freight forwarder  Social Needs  . Financial resource strain: Not on file  . Food insecurity    Worry: Not on file    Inability: Not on file  . Transportation needs    Medical: Not on file    Non-medical: Not on file  Tobacco Use  . Smoking status: Never Smoker  . Smokeless tobacco: Never Used  Substance and Sexual Activity  . Alcohol use: Yes    Comment: rarely  . Drug use: No  . Sexual activity: Yes    Partners: Male  Lifestyle  . Physical activity    Days per week: Not on file    Minutes per session: Not on file  . Stress: Not on file  Relationships  . Social Herbalist on phone: Not on file    Gets together: Not on file    Attends religious service: Not on file    Active member of club or organization: Not on file    Attends meetings of clubs or organizations: Not on file    Relationship status: Not on file  . Intimate partner violence    Fear of current or ex partner: Not on file    Emotionally abused: Not on file    Physically abused:  Not on file    Forced sexual activity: Not on file  Other Topics Concern  . Not on file  Social History Narrative   Occ exercise 3-4 days per week. . Daily caffeine 1-2 per day.    Health Maintenance  Topic Date Due  . INFLUENZA VACCINE  02/06/2019  . PAP SMEAR-Modifier  05/18/2020  . TETANUS/TDAP  04/07/2021  . HIV Screening  Completed    The following portions of the patient's history were reviewed and updated as appropriate: allergies, current medications, past family history, past medical history, past social history, past surgical history and problem list.  Review of Systems A comprehensive review of systems was negative.   Objective:    BP 109/65   Pulse 90   Ht 5\' 4"  (1.626 m)   Wt 173 lb (78.5 kg)   LMP 12/30/2018 (Exact Date)   SpO2 100%   BMI 29.70 kg/m  General appearance: alert, cooperative and appears stated age Head: Normocephalic, without obvious abnormality, atraumatic Eyes: conj clear, EOMI, PEERLA Ears: normal TM's and external ear canals both  ears Nose: Nares normal. Septum midline. Mucosa normal. No drainage or sinus tenderness. Throat: lips, mucosa, and tongue normal; teeth and gums normal Neck: no adenopathy, no carotid bruit, no JVD, supple, symmetrical, trachea midline and thyroid not enlarged, symmetric, no tenderness/mass/nodules Back: symmetric, no curvature. ROM normal. No CVA tenderness. Lungs: clear to auscultation bilaterally Breasts: normal appearance, no masses or tenderness Heart: regular rate and rhythm, S1, S2 normal, no murmur, click, rub or gallop Abdomen: soft, non-tender; bowel sounds normal; no masses,  no organomegaly Extremities: extremities normal, atraumatic, no cyanosis or edema Pulses: 2+ and symmetric Skin: Skin color, texture, turgor normal. No rashes or lesions. Pink raised oval shaped mole with slightly rough surface.  Lymph nodes: Cervical, supraclavicular, and axillary nodes normal. Neurologic: Alert and oriented X 3,  normal strength and tone. Normal symmetric reflexes. Normal coordination and gait    Assessment:    Healthy female exam.      Plan:     See After Visit Summary for Counseling Recommendations    Keep up a regular exercise program and make sure you are eating a healthy diet Try to eat 4 servings of dairy a day, or if you are lactose intolerant take a calcium with vitamin D daily.  Your vaccines are up to date.  Discussed starting mammograms.    Keep an eye on the mole for now looks ok.

## 2018-12-31 ENCOUNTER — Other Ambulatory Visit: Payer: Self-pay | Admitting: Family Medicine

## 2018-12-31 DIAGNOSIS — R928 Other abnormal and inconclusive findings on diagnostic imaging of breast: Secondary | ICD-10-CM

## 2018-12-31 LAB — CBC WITH DIFFERENTIAL/PLATELET
Absolute Monocytes: 291 cells/uL (ref 200–950)
Basophils Absolute: 39 cells/uL (ref 0–200)
Basophils Relative: 0.7 %
Eosinophils Absolute: 112 cells/uL (ref 15–500)
Eosinophils Relative: 2 %
HCT: 42 % (ref 35.0–45.0)
Hemoglobin: 13.9 g/dL (ref 11.7–15.5)
Lymphs Abs: 1361 cells/uL (ref 850–3900)
MCH: 32 pg (ref 27.0–33.0)
MCHC: 33.1 g/dL (ref 32.0–36.0)
MCV: 96.8 fL (ref 80.0–100.0)
MPV: 11.5 fL (ref 7.5–12.5)
Monocytes Relative: 5.2 %
Neutro Abs: 3797 cells/uL (ref 1500–7800)
Neutrophils Relative %: 67.8 %
Platelets: 204 10*3/uL (ref 140–400)
RBC: 4.34 10*6/uL (ref 3.80–5.10)
RDW: 12.3 % (ref 11.0–15.0)
Total Lymphocyte: 24.3 %
WBC: 5.6 10*3/uL (ref 3.8–10.8)

## 2018-12-31 LAB — LIPID PANEL W/REFLEX DIRECT LDL
Cholesterol: 153 mg/dL (ref <200)
HDL: 53 mg/dL (ref >=50)
LDL Cholesterol (Calc): 82 mg/dL (calc)
Non-HDL Cholesterol (Calc): 100 mg/dL (calc) (ref <130)
Total CHOL/HDL Ratio: 2.9 (calc) (ref <5.0)
Triglycerides: 85 mg/dL (ref <150)

## 2018-12-31 LAB — COMPLETE METABOLIC PANEL WITH GFR
AG Ratio: 2 (calc) (ref 1.0–2.5)
ALT: 11 U/L (ref 6–29)
AST: 13 U/L (ref 10–30)
Albumin: 4.5 g/dL (ref 3.6–5.1)
Alkaline phosphatase (APISO): 26 U/L — ABNORMAL LOW (ref 31–125)
BUN: 9 mg/dL (ref 7–25)
CO2: 27 mmol/L (ref 20–32)
Calcium: 9.4 mg/dL (ref 8.6–10.2)
Chloride: 104 mmol/L (ref 98–110)
Creat: 0.71 mg/dL (ref 0.50–1.10)
GFR, Est African American: 123 mL/min/{1.73_m2} (ref >=60)
GFR, Est Non African American: 107 mL/min/{1.73_m2} (ref >=60)
Globulin: 2.2 g/dL (calc) (ref 1.9–3.7)
Glucose, Bld: 93 mg/dL (ref 65–99)
Potassium: 4.4 mmol/L (ref 3.5–5.3)
Sodium: 140 mmol/L (ref 135–146)
Total Bilirubin: 0.7 mg/dL (ref 0.2–1.2)
Total Protein: 6.7 g/dL (ref 6.1–8.1)

## 2018-12-31 LAB — TSH: TSH: 1.15 mIU/L

## 2019-01-06 ENCOUNTER — Other Ambulatory Visit: Payer: Self-pay | Admitting: Family Medicine

## 2019-01-06 ENCOUNTER — Other Ambulatory Visit: Payer: Self-pay

## 2019-01-06 ENCOUNTER — Ambulatory Visit
Admission: RE | Admit: 2019-01-06 | Discharge: 2019-01-06 | Disposition: A | Discharge status: Home / Self Care | Payer: PRIVATE HEALTH INSURANCE | Source: Ambulatory Visit | Attending: Family Medicine | Admitting: Family Medicine

## 2019-01-06 DIAGNOSIS — R928 Other abnormal and inconclusive findings on diagnostic imaging of breast: Secondary | ICD-10-CM

## 2019-01-06 DIAGNOSIS — N6310 Unspecified lump in the right breast, unspecified quadrant: Secondary | ICD-10-CM

## 2019-01-06 HISTORY — PX: BREAST BIOPSY: SHX20

## 2019-01-12 ENCOUNTER — Ambulatory Visit
Admission: RE | Admit: 2019-01-12 | Discharge: 2019-01-12 | Disposition: A | Discharge status: Home / Self Care | Payer: PRIVATE HEALTH INSURANCE | Source: Ambulatory Visit | Attending: Family Medicine | Admitting: Family Medicine

## 2019-01-12 ENCOUNTER — Encounter: Payer: Self-pay | Admitting: Family Medicine

## 2019-01-12 DIAGNOSIS — N6310 Unspecified lump in the right breast, unspecified quadrant: Secondary | ICD-10-CM

## 2019-06-08 ENCOUNTER — Other Ambulatory Visit: Payer: Self-pay | Admitting: Family Medicine

## 2019-06-08 DIAGNOSIS — N6489 Other specified disorders of breast: Secondary | ICD-10-CM

## 2019-06-14 ENCOUNTER — Encounter: Payer: Self-pay | Admitting: Family Medicine

## 2019-07-06 ENCOUNTER — Ambulatory Visit
Admission: RE | Admit: 2019-07-06 | Discharge: 2019-07-06 | Disposition: A | Discharge status: Home / Self Care | Payer: PRIVATE HEALTH INSURANCE | Source: Ambulatory Visit | Attending: Family Medicine | Admitting: Family Medicine

## 2019-07-06 ENCOUNTER — Other Ambulatory Visit: Payer: Self-pay

## 2019-07-06 ENCOUNTER — Ambulatory Visit: Payer: PRIVATE HEALTH INSURANCE

## 2019-07-06 ENCOUNTER — Other Ambulatory Visit: Payer: Self-pay | Admitting: Family Medicine

## 2019-07-06 DIAGNOSIS — N6489 Other specified disorders of breast: Secondary | ICD-10-CM

## 2019-09-06 ENCOUNTER — Encounter: Payer: Self-pay | Admitting: Physician Assistant

## 2019-09-06 ENCOUNTER — Ambulatory Visit: Payer: PRIVATE HEALTH INSURANCE | Admitting: Physician Assistant

## 2019-09-06 ENCOUNTER — Other Ambulatory Visit: Payer: Self-pay

## 2019-09-06 VITALS — BP 115/59 | HR 90 | Ht 64.0 in | Wt 177.0 lb

## 2019-09-06 DIAGNOSIS — L820 Inflamed seborrheic keratosis: Secondary | ICD-10-CM

## 2019-09-06 DIAGNOSIS — L989 Disorder of the skin and subcutaneous tissue, unspecified: Secondary | ICD-10-CM

## 2019-09-06 NOTE — Patient Instructions (Signed)
Seborrheic Keratosis °A seborrheic keratosis is a common, noncancerous (benign) skin growth. These growths are velvety, waxy, rough, tan, brown, or black spots that appear on the skin. These skin growths can be flat or raised, and scaly. °What are the causes? °The cause of this condition is not known. °What increases the risk? °You are more likely to develop this condition if you: °· Have a family history of seborrheic keratosis. °· Are 50 or older. °· Are pregnant. °· Have had estrogen replacement therapy. °What are the signs or symptoms? °Symptoms of this condition include growths on the face, chest, shoulders, back, or other areas. These growths: °· Are usually painless, but may become irritated and itchy. °· Can be yellow, brown, black, or other colors. °· Are slightly raised or have a flat surface. °· Are sometimes rough or wart-like in texture. °· Are often velvety or waxy on the surface. °· Are round or oval-shaped. °· Often occur in groups, but may occur as a single growth. °How is this diagnosed? °This condition is diagnosed with a medical history and physical exam. °· A sample of the growth may be tested (skin biopsy). °· You may need to see a skin specialist (dermatologist). °How is this treated? °Treatment is not usually needed for this condition, unless the growths are irritated or bleed often. °· You may also choose to have the growths removed if you do not like their appearance. °? Most commonly, these growths are treated with a procedure in which liquid nitrogen is applied to "freeze" off the growth (cryosurgery). °? They may also be burned off with electricity (electrocautery) or removed by scraping (curettage). °Follow these instructions at home: °· Watch your growth for any changes. °· Keep all follow-up visits as told by your health care provider. This is important. °· Do not scratch or pick at the growth or growths. This can cause them to become irritated or infected. °Contact a health care  provider if: °· You suddenly have many new growths. °· Your growth bleeds, itches, or hurts. °· Your growth suddenly becomes larger or changes color. °Summary °· A seborrheic keratosis is a common, noncancerous (benign) skin growth. °· Treatment is not usually needed for this condition, unless the growths are irritated or bleed often. °· Watch your growth for any changes. °· Contact a health care provider if you suddenly have many new growths or your growth suddenly becomes larger or changes color. °· Keep all follow-up visits as told by your health care provider. This is important. °This information is not intended to replace advice given to you by your health care provider. Make sure you discuss any questions you have with your health care provider. °Document Revised: 11/06/2017 Document Reviewed: 11/06/2017 °Elsevier Patient Education © 2020 Elsevier Inc. ° °

## 2019-09-06 NOTE — Progress Notes (Signed)
Subjective:    Patient ID: Kathy Kaiser, female    DOB: October 24, 1975, 44 y.o.   MRN: [id number]  HPI Patient is a 44 year old female who presents with a 49mm x 49mm slightly raised skin growth on right forearm. Skin growth is red with central white scaling. Patient noticed it 3 weeks ago and denies pruritis, warmth, pain, irritation, or bleeding from the skin growth. She has tried benadryl cream and that has not improved appearance. Denies any other areas of skin changes or systemic symptoms such as fever or chills. Will not go away.   .. Active Ambulatory Problems    Diagnosis Date Noted  . Asthma 01/07/2008  . Overweight(278.02) 11/07/2011  . GAD (generalized anxiety disorder) 11/10/2013  . Lumbar degenerative disc disease 02/13/2015  . Contraception 02/14/2016  . MS (multiple sclerosis) ([place]) 01/27/2018  . Numbness and tingling of both legs 12/30/2018  . Calculus of gallbladder without cholecystitis without obstruction 09/13/2016  . Skin lesion 09/06/2019   Resolved Ambulatory Problems    Diagnosis Date Noted  . NEVUS, ATYPICAL 11/08/2009  . SINUSITIS- ACUTE-NOS 07/06/2008  . DYSPEPSIA 01/07/2008  . AMENORRHEA 02/01/2008  . RUQ PAIN 01/07/2008  . Cystitis with hematuria 02/14/2016   Past Medical History:  Diagnosis Date  . Asthma   . GERD (gastroesophageal reflux disease)   . Renal calculi   . Renal disorder        Review of Systems  Constitutional: Negative for chills and fever.  Skin: Negative for pallor, rash and wound.  Hematological: Does not bruise/bleed easily.       Objective:   Physical Exam Vitals reviewed.  Constitutional:      Appearance: Normal appearance.  Skin:    Findings: No abrasion, abscess, bruising, signs of injury or wound. Rash: 5 mm x 9mm skin growth on right forearm.       Neurological:     Mental Status: She is alert.  Psychiatric:        Mood and Affect: Mood normal.        Behavior: Behavior normal.           Assessment  & Plan:  .Marland KitchenTashi was seen today for skin problem.  Diagnoses and all orders for this visit:  Skin lesion   Appears like SK vs AK. No worrisome features. Only present lesion. She has fair skin. It does not bother her but does not like the appearance and where it is at.   Cryotherapy Procedure Note  Pre-operative Diagnosis: SK vs AK  Post-operative Diagnosis: SK vs AK  Locations: right forearm  Indications: new lesion not going away  Procedure Details  History of allergy to iodine: no. Pacemaker? no.  Patient informed of risks (permanent scarring, infection, light or dark discoloration, bleeding, infection, weakness, numbness and recurrence of the lesion) and benefits of the procedure and verbal informed consent obtained.  The areas are treated with liquid nitrogen therapy, frozen until ice ball extended 2 mm beyond lesion, allowed to thaw, and treated again. The patient tolerated procedure well.  The patient was instructed on post-op care, warned that there may be blister formation, redness and pain. Recommend OTC analgesia as needed for pain.  Condition: Stable  Complications: none.  Plan: 1. Instructed to keep the area dry and covered for 24-48h and clean thereafter. 2. Warning signs of infection were reviewed.   3. Recommended that the patient use OTC acetaminophen as needed for pain.   Marland KitchenVernetta Honey PA-C, have reviewed and  agree with the above documentation in it's entirety.

## 2019-11-04 ENCOUNTER — Other Ambulatory Visit: Payer: Self-pay | Admitting: Family Medicine

## 2019-11-04 DIAGNOSIS — Z1231 Encounter for screening mammogram for malignant neoplasm of breast: Secondary | ICD-10-CM

## 2019-12-20 ENCOUNTER — Encounter: Payer: Self-pay | Admitting: Family Medicine

## 2019-12-20 DIAGNOSIS — G35 Multiple sclerosis: Secondary | ICD-10-CM

## 2019-12-20 DIAGNOSIS — Z0000 Encounter for general adult medical examination without abnormal findings: Secondary | ICD-10-CM

## 2019-12-20 NOTE — Telephone Encounter (Signed)
Signed.

## 2019-12-20 NOTE — Telephone Encounter (Signed)
Lipid, CBC, and CMP pended 

## 2019-12-31 ENCOUNTER — Ambulatory Visit
Admission: RE | Admit: 2019-12-31 | Discharge: 2019-12-31 | Disposition: A | Discharge status: Home / Self Care | Payer: PRIVATE HEALTH INSURANCE | Source: Ambulatory Visit | Attending: Family Medicine | Admitting: Family Medicine

## 2019-12-31 ENCOUNTER — Other Ambulatory Visit: Payer: Self-pay

## 2019-12-31 DIAGNOSIS — Z1231 Encounter for screening mammogram for malignant neoplasm of breast: Secondary | ICD-10-CM

## 2020-01-03 ENCOUNTER — Encounter: Payer: PRIVATE HEALTH INSURANCE | Admitting: Family Medicine

## 2020-01-13 ENCOUNTER — Other Ambulatory Visit: Payer: Self-pay

## 2020-01-13 ENCOUNTER — Ambulatory Visit (INDEPENDENT_AMBULATORY_CARE_PROVIDER_SITE_OTHER): Payer: PRIVATE HEALTH INSURANCE | Admitting: Family Medicine

## 2020-01-13 ENCOUNTER — Encounter: Payer: Self-pay | Admitting: Family Medicine

## 2020-01-13 ENCOUNTER — Other Ambulatory Visit (HOSPITAL_COMMUNITY)
Admission: RE | Admit: 2020-01-13 | Discharge: 2020-01-13 | Disposition: A | Discharge status: Home / Self Care | Payer: PRIVATE HEALTH INSURANCE | Source: Ambulatory Visit | Attending: Family Medicine | Admitting: Family Medicine

## 2020-01-13 VITALS — BP 97/61 | HR 88 | Ht 64.0 in | Wt 180.0 lb

## 2020-01-13 DIAGNOSIS — Z124 Encounter for screening for malignant neoplasm of cervix: Secondary | ICD-10-CM

## 2020-01-13 DIAGNOSIS — Z0000 Encounter for general adult medical examination without abnormal findings: Secondary | ICD-10-CM

## 2020-01-13 LAB — CBC WITH DIFFERENTIAL/PLATELET
Absolute Monocytes: 403 cells/uL (ref 200–950)
Basophils Absolute: 63 cells/uL (ref 0–200)
Basophils Relative: 1 %
Eosinophils Absolute: 120 cells/uL (ref 15–500)
Eosinophils Relative: 1.9 %
HCT: 40.9 % (ref 35.0–45.0)
Hemoglobin: 13.9 g/dL (ref 11.7–15.5)
Lymphs Abs: 1411 cells/uL (ref 850–3900)
MCH: 32.4 pg (ref 27.0–33.0)
MCHC: 34 g/dL (ref 32.0–36.0)
MCV: 95.3 fL (ref 80.0–100.0)
MPV: 11.5 fL (ref 7.5–12.5)
Monocytes Relative: 6.4 %
Neutro Abs: 4303 cells/uL (ref 1500–7800)
Neutrophils Relative %: 68.3 %
Platelets: 210 10*3/uL (ref 140–400)
RBC: 4.29 10*6/uL (ref 3.80–5.10)
RDW: 12.2 % (ref 11.0–15.0)
Total Lymphocyte: 22.4 %
WBC: 6.3 10*3/uL (ref 3.8–10.8)

## 2020-01-13 LAB — COMPLETE METABOLIC PANEL WITH GFR
AG Ratio: 1.8 (calc) (ref 1.0–2.5)
ALT: 32 U/L — ABNORMAL HIGH (ref 6–29)
AST: 21 U/L (ref 10–30)
Albumin: 4.3 g/dL (ref 3.6–5.1)
Alkaline phosphatase (APISO): 30 U/L — ABNORMAL LOW (ref 31–125)
BUN: 9 mg/dL (ref 7–25)
CO2: 27 mmol/L (ref 20–32)
Calcium: 9.1 mg/dL (ref 8.6–10.2)
Chloride: 107 mmol/L (ref 98–110)
Creat: 0.56 mg/dL (ref 0.50–1.10)
GFR, Est African American: 134 mL/min/{1.73_m2} (ref >=60)
GFR, Est Non African American: 116 mL/min/{1.73_m2} (ref >=60)
Globulin: 2.4 g/dL (calc) (ref 1.9–3.7)
Glucose, Bld: 87 mg/dL (ref 65–99)
Potassium: 4.1 mmol/L (ref 3.5–5.3)
Sodium: 139 mmol/L (ref 135–146)
Total Bilirubin: 0.8 mg/dL (ref 0.2–1.2)
Total Protein: 6.7 g/dL (ref 6.1–8.1)

## 2020-01-13 LAB — LIPID PANEL W/REFLEX DIRECT LDL
Cholesterol: 166 mg/dL (ref <200)
HDL: 59 mg/dL (ref >=50)
LDL Cholesterol (Calc): 92 mg/dL (calc)
Non-HDL Cholesterol (Calc): 107 mg/dL (calc) (ref <130)
Total CHOL/HDL Ratio: 2.8 (calc) (ref <5.0)
Triglycerides: 64 mg/dL (ref <150)

## 2020-01-13 NOTE — Patient Instructions (Addendum)
Health Maintenance, Female Adopting a healthy lifestyle and getting preventive care are important in promoting health and wellness. Ask your health care provider about:  The right schedule for you to have regular tests and exams.  Things you can do on your own to prevent diseases and keep yourself healthy. What should I know about diet, weight, and exercise? Eat a healthy diet   Eat a diet that includes plenty of vegetables, fruits, low-fat dairy products, and lean protein.  Do not eat a lot of foods that are high in solid fats, added sugars, or sodium. Maintain a healthy weight Body mass index (BMI) is used to identify weight problems. It estimates body fat based on height and weight. Your health care provider can help determine your BMI and help you achieve or maintain a healthy weight. Get regular exercise Get regular exercise. This is one of the most important things you can do for your health. Most adults should:  Exercise for at least 150 minutes each week. The exercise should increase your heart rate and make you sweat (moderate-intensity exercise).  Do strengthening exercises at least twice a week. This is in addition to the moderate-intensity exercise.  Spend less time sitting. Even light physical activity can be beneficial. Watch cholesterol and blood lipids Have your blood tested for lipids and cholesterol at 42 years of age, then have this test every 5 years. Have your cholesterol levels checked more often if:  Your lipid or cholesterol levels are high.  You are older than 42 years of age.  You are at high risk for heart disease. What should I know about cancer screening? Depending on your health history and family history, you may need to have cancer screening at various ages. This may include screening for:  Breast cancer.  Cervical cancer.  Colorectal cancer.  Skin cancer.  Lung cancer. What should I know about heart disease, diabetes, and high blood  pressure? Blood pressure and heart disease  High blood pressure causes heart disease and increases the risk of stroke. This is more likely to develop in people who have high blood pressure readings, are of African descent, or are overweight.  Have your blood pressure checked: ? Every 3-5 years if you are 18-39 years of age. ? Every year if you are 40 years old or older. Diabetes Have regular diabetes screenings. This checks your fasting blood sugar level. Have the screening done:  Once every three years after age 40 if you are at a normal weight and have a low risk for diabetes.  More often and at a younger age if you are overweight or have a high risk for diabetes. What should I know about preventing infection? Hepatitis B If you have a higher risk for hepatitis B, you should be screened for this virus. Talk with your health care provider to find out if you are at risk for hepatitis B infection. Hepatitis C Testing is recommended for:  Everyone born from 1945 through 1965.  Anyone with known risk factors for hepatitis C. Sexually transmitted infections (STIs)  Get screened for STIs, including gonorrhea and chlamydia, if: ? You are sexually active and are younger than 42 years of age. ? You are older than 42 years of age and your health care provider tells you that you are at risk for this type of infection. ? Your sexual activity has changed since you were last screened, and you are at increased risk for chlamydia or gonorrhea. Ask your health care provider if   you are at risk.  Ask your health care provider about whether you are at high risk for HIV. Your health care provider may recommend a prescription medicine to help prevent HIV infection. If you choose to take medicine to prevent HIV, you should first get tested for HIV. You should then be tested every 3 months for as long as you are taking the medicine. Pregnancy  If you are about to stop having your period (premenopausal) and  you may become pregnant, seek counseling before you get pregnant.  Take 400 to 800 micrograms (mcg) of folic acid every day if you become pregnant.  Ask for birth control (contraception) if you want to prevent pregnancy. Osteoporosis and menopause Osteoporosis is a disease in which the bones lose minerals and strength with aging. This can result in bone fractures. If you are 65 years old or older, or if you are at risk for osteoporosis and fractures, ask your health care provider if you should:  Be screened for bone loss.  Take a calcium or vitamin D supplement to lower your risk of fractures.  Be given hormone replacement therapy (HRT) to treat symptoms of menopause. Follow these instructions at home: Lifestyle  Do not use any products that contain nicotine or tobacco, such as cigarettes, e-cigarettes, and chewing tobacco. If you need help quitting, ask your health care provider.  Do not use street drugs.  Do not share needles.  Ask your health care provider for help if you need support or information about quitting drugs. Alcohol use  Do not drink alcohol if: ? Your health care provider tells you not to drink. ? You are pregnant, may be pregnant, or are planning to become pregnant.  If you drink alcohol: ? Limit how much you use to 0-1 drink a day. ? Limit intake if you are breastfeeding.  Be aware of how much alcohol is in your drink. In the [place] one drink equals one 12 oz bottle of beer (355 mL), one 5 oz glass of wine (148 mL), or one 1 oz glass of hard liquor (44 mL). General instructions  Schedule regular health, dental, and eye exams.  Stay current with your vaccines.  Tell your health care provider if: ? You often feel depressed. ? You have ever been abused or do not feel safe at home. Summary  Adopting a healthy lifestyle and getting preventive care are important in promoting health and wellness.  Follow your health care provider's instructions about healthy  diet, exercising, and getting tested or screened for diseases.  Follow your health care provider's instructions on monitoring your cholesterol and blood pressure. This information is not intended to replace advice given to you by your health care provider. Make sure you discuss any questions you have with your health care provider. Document Revised: 06/17/2018 Document Reviewed: 06/17/2018 Elsevier Patient Education  2020 Elsevier Inc[employer]  

## 2020-01-13 NOTE — Progress Notes (Signed)
Subjective:     Kathy Kaiser is a 44 y.o. female and is here for a comprehensive physical exam. The patient reports no problems.  Social History   Socioeconomic History  . Marital status: Married    Spouse name: Corene Cornea  . Number of children: 2  . Years of education: Not on file  . Highest education level: Not on file  Occupational History  . Occupation: Case Manager  Tobacco Use  . Smoking status: Never Smoker  . Smokeless tobacco: Never Used  Substance and Sexual Activity  . Alcohol use: Yes    Comment: rarely  . Drug use: No  . Sexual activity: Yes    Partners: Male  Other Topics Concern  . Not on file  Social History Narrative   Occ exercise 3-4 days per week. . Daily caffeine 1-2 per day.    Social Determinants of Health   Financial Resource Strain:   . Difficulty of Paying Living Expenses:   Food Insecurity:   . Worried About Charity fundraiser in the Last Year:   . Arboriculturist in the Last Year:   Transportation Needs:   . Film/video editor (Medical):   Marland Kitchen Lack of Transportation (Non-Medical):   Physical Activity:   . Days of Exercise per Week:   . Minutes of Exercise per Session:   Stress:   . Feeling of Stress :   Social Connections:   . Frequency of Communication with Friends and Family:   . Frequency of Social Gatherings with Friends and Family:   . Attends Religious Services:   . Active Member of Clubs or Organizations:   . Attends Archivist Meetings:   Marland Kitchen Marital Status:   Intimate Partner Violence:   . Fear of Current or Ex-Partner:   . Emotionally Abused:   Marland Kitchen Physically Abused:   . Sexually Abused:    Health Maintenance  Topic Date Due  . Hepatitis C Screening  Never done  . INFLUENZA VACCINE  02/06/2020  . PAP SMEAR-Modifier  05/18/2020  . TETANUS/TDAP  04/07/2021  . COVID-19 Vaccine  Completed  . HIV Screening  Completed    The following portions of the patient's history were reviewed and updated as appropriate:  allergies, current medications, past family history, past medical history, past social history, past surgical history and problem list.  Review of Systems A comprehensive review of systems was negative.   Objective:    BP 97/61   Pulse 88   Ht 5\' 4"  (1.626 m)   Wt 180 lb (81.6 kg)   LMP 12/25/2019   SpO2 100%   BMI 30.90 kg/m  General appearance: alert, cooperative and appears stated age Head: Normocephalic, without obvious abnormality, atraumatic Eyes: conj clear, EOMi PEERLA Ears: normal TM's and external ear canals both ears Nose: Nares normal. Septum midline. Mucosa normal. No drainage or sinus tenderness. Throat: lips, mucosa, and tongue normal; teeth and gums normal Neck: no adenopathy, no carotid bruit, no JVD, supple, symmetrical, trachea midline and thyroid not enlarged, symmetric, no tenderness/mass/nodules Back: symmetric, no curvature. ROM normal. No CVA tenderness. Lungs: clear to auscultation bilaterally Heart: regular rate and rhythm, S1, S2 normal, no murmur, click, rub or gallop Abdomen: soft, non-tender; bowel sounds normal; no masses,  no organomegaly Pelvic: cervix normal in appearance, external genitalia normal, no adnexal masses or tenderness, no cervical motion tenderness, rectovaginal septum normal, uterus normal size, shape, and consistency and vagina normal without discharge Extremities: extremities normal, atraumatic, no cyanosis  or edema Pulses: 2+ and symmetric Skin: Skin color, texture, turgor normal. No rashes or lesions Lymph nodes: Cervical, supraclavicular, and axillary nodes normal. Neurologic: Alert and oriented X 3, normal strength and tone. Normal symmetric reflexes. Normal coordination and gait    Assessment:    Healthy female exam.      Plan:     See After Visit Summary for Counseling Recommendations   Keep up a regular exercise program and make sure you are eating a healthy diet Try to eat 4 servings of dairy a day, or if you are  lactose intolerant take a calcium with vitamin D daily.  Your vaccines are up to date.  Labs drawn today. Pap performed.

## 2020-01-14 ENCOUNTER — Other Ambulatory Visit: Payer: Self-pay | Admitting: *Deleted

## 2020-01-14 DIAGNOSIS — R7401 Elevation of levels of liver transaminase levels: Secondary | ICD-10-CM

## 2020-01-14 LAB — CYTOLOGY - PAP
Comment: NEGATIVE
Diagnosis: NEGATIVE
High risk HPV: NEGATIVE

## 2020-01-14 NOTE — Progress Notes (Signed)
Call patient: Your Pap smear is normal. Repeat in 5 years.

## 2020-01-23 ENCOUNTER — Encounter: Payer: Self-pay | Admitting: Family Medicine

## 2020-01-26 NOTE — Telephone Encounter (Signed)
Routing as an FYI

## 2020-01-28 ENCOUNTER — Encounter: Payer: Self-pay | Admitting: Family Medicine

## 2020-03-23 LAB — ALT: ALT: 17 U/L (ref 6–29)

## 2020-07-11 ENCOUNTER — Encounter: Payer: Self-pay | Admitting: Family Medicine

## 2020-07-11 MED ORDER — ALBUTEROL SULFATE HFA 108 (90 BASE) MCG/ACT IN AERS: 2.0000 | INHALATION_SPRAY | Freq: Four times a day (QID) | RESPIRATORY_TRACT | 3 refills | Status: DC | PRN

## 2020-07-14 MED ORDER — PREDNISONE 10 MG PO TABS: ORAL_TABLET | ORAL | 0 refills | Status: AC

## 2020-07-14 NOTE — Addendum Note (Signed)
Addended by: Beatrice Lecher D on: 07/14/2020 10:33 AM   Modules accepted: Orders

## 2020-07-18 IMAGING — MG DIGITAL SCREENING BILATERAL MAMMOGRAM WITH TOMO AND CAD
6 of 10 series · 6 of 30 positions shown · non-contrast
Comparison: None

CLINICAL DATA: Screening.

EXAM:
DIGITAL SCREENING BILATERAL MAMMOGRAM WITH TOMO AND CAD

[R MLO synth-2D (1 of 2)]
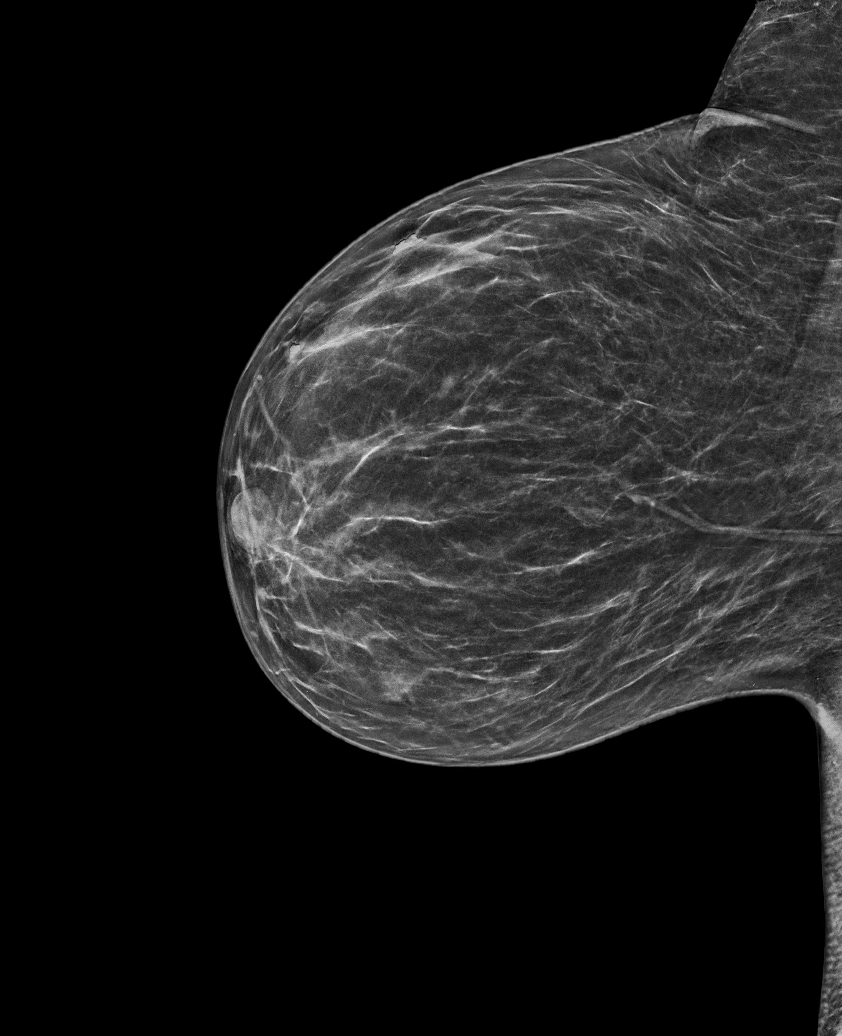

[L CC synth-2D]
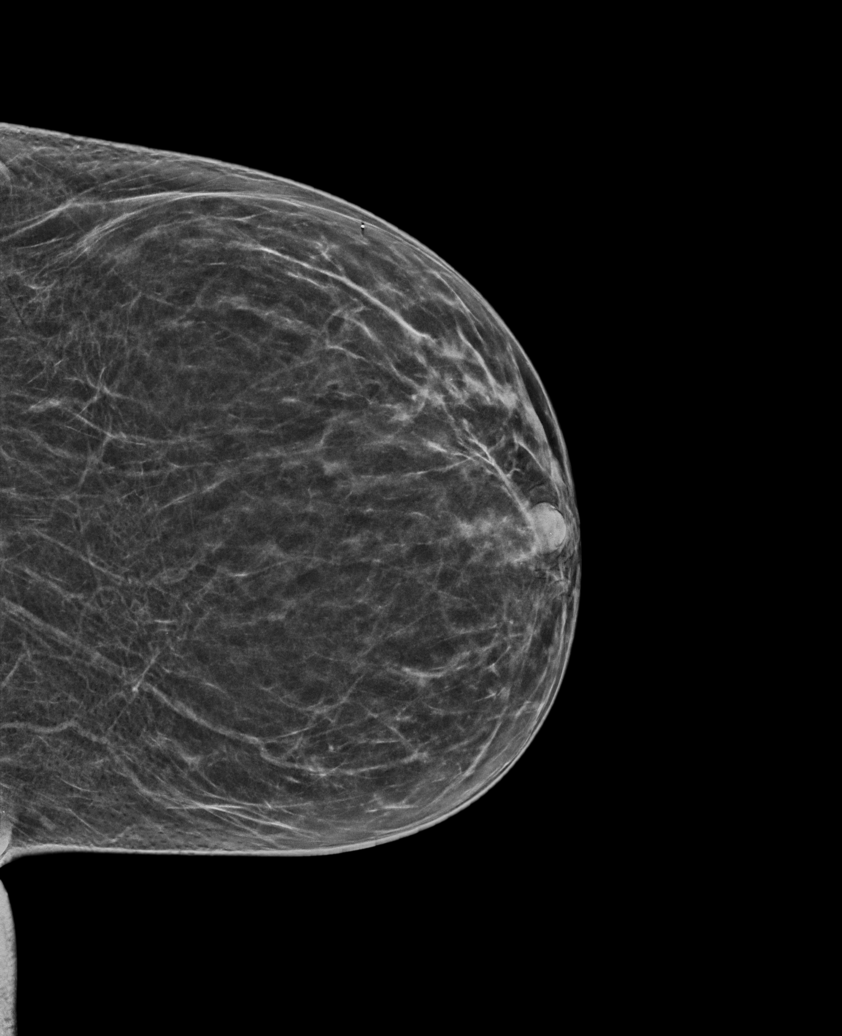

[L MLO synth-2D]
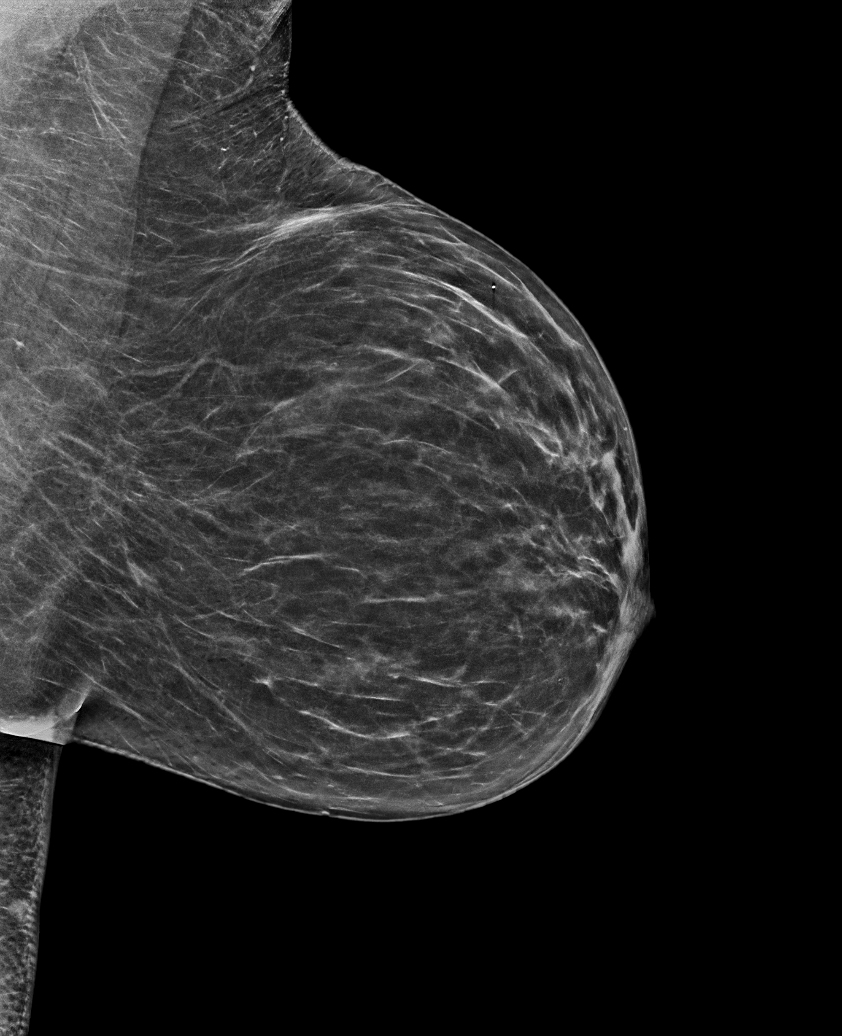

[R CC synth-2D]
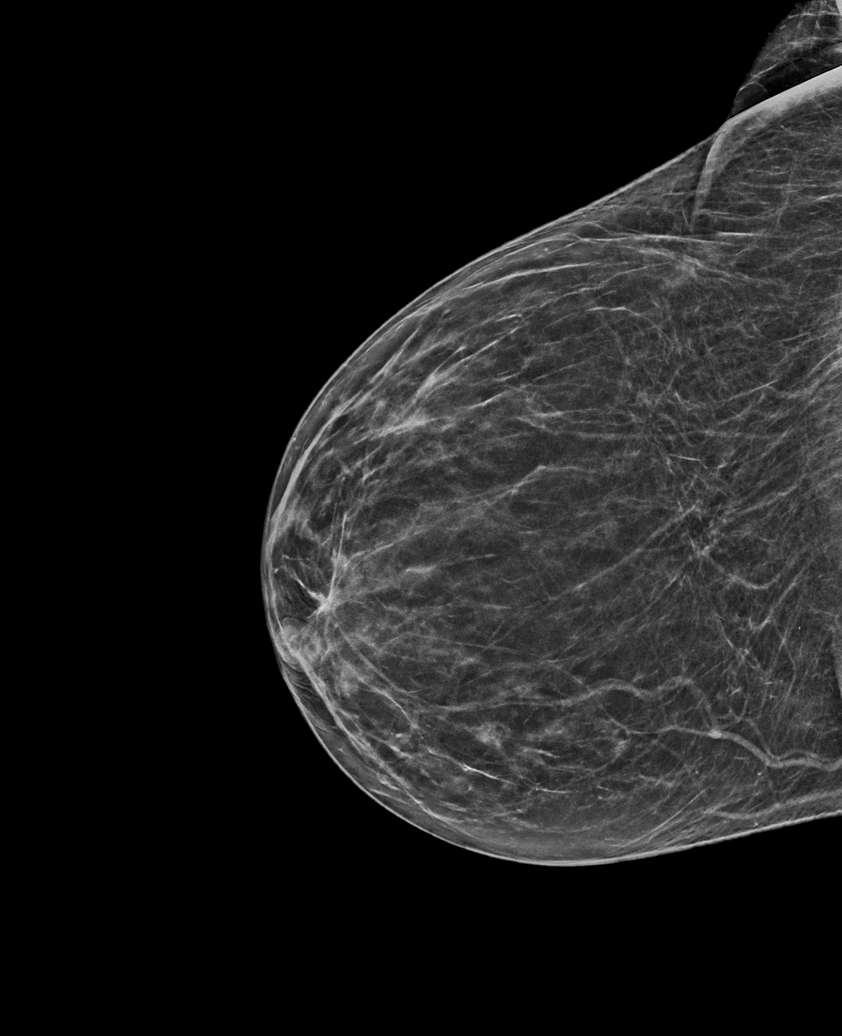

[R MLO synth-2D (2 of 2)]
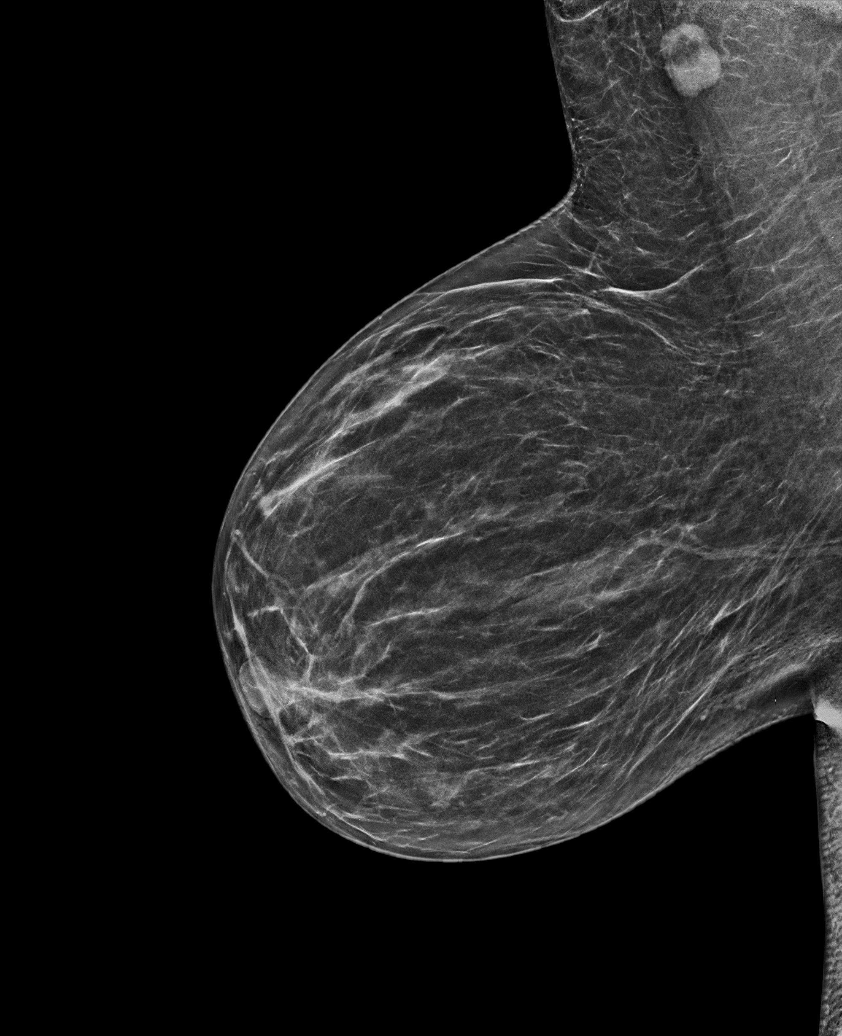

[L MLO tomo · tomo slice 33/66.0]
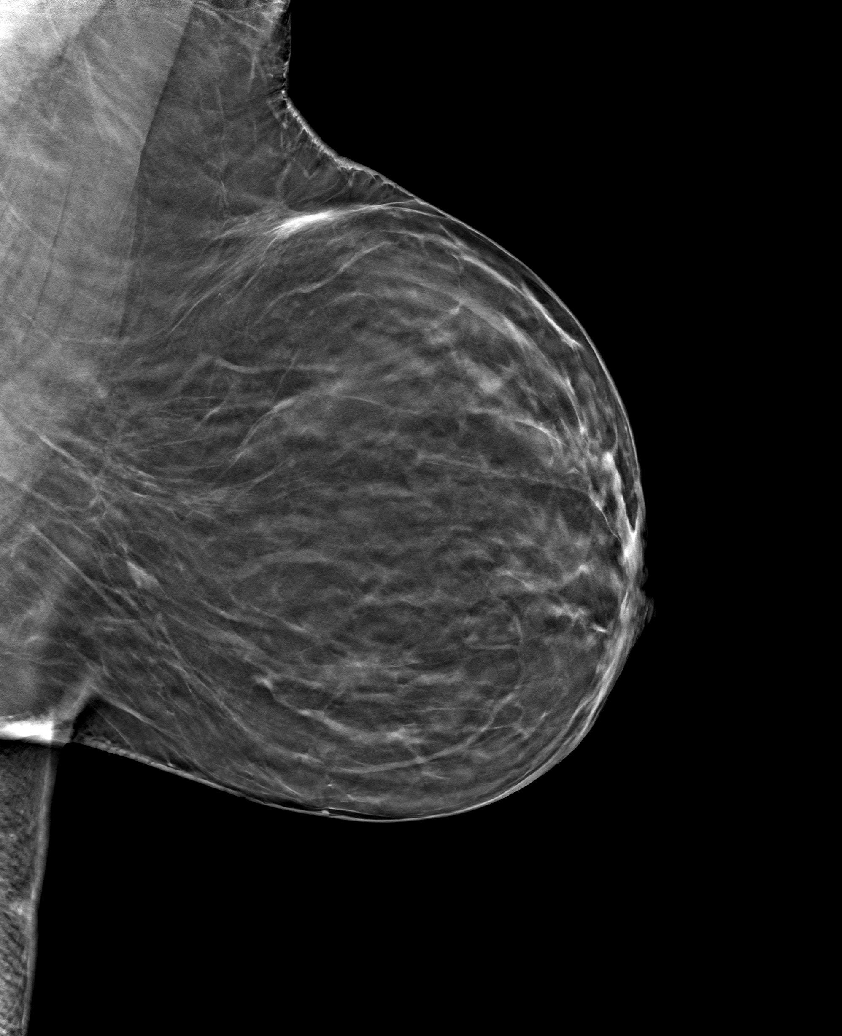

[6 of 30 positions shown; findings below may reference images not displayed]

ACR Breast Density Category b: There are scattered areas of
fibroglandular density.
FINDINGS: In the right breast, a possible mass warrants further evaluation.
Possibly enlarged RIGHT axillary lymph node is also identified. In
the left breast, no findings suspicious for malignancy. Images were
processed with CAD.
IMPRESSION: Further evaluation is suggested for possible mass in the right
breast.

RECOMMENDATION:
Diagnostic mammogram and possibly ultrasound of the right breast.
Recommend ultrasound of the RIGHT axilla.

The patient will be contacted regarding the findings, and additional
imaging will be scheduled.

BI-RADS CATEGORY  0: Incomplete. Need additional imaging evaluation
and/or prior mammograms for comparison.

## 2020-11-29 ENCOUNTER — Other Ambulatory Visit: Payer: Self-pay | Admitting: Family Medicine

## 2020-11-29 DIAGNOSIS — Z1231 Encounter for screening mammogram for malignant neoplasm of breast: Secondary | ICD-10-CM

## 2021-01-09 ENCOUNTER — Other Ambulatory Visit: Payer: Self-pay

## 2021-01-09 ENCOUNTER — Ambulatory Visit
Admission: RE | Admit: 2021-01-09 | Discharge: 2021-01-09 | Disposition: A | Discharge status: Home / Self Care | Payer: No Typology Code available for payment source | Source: Ambulatory Visit | Attending: Family Medicine | Admitting: Family Medicine

## 2021-01-09 DIAGNOSIS — Z1231 Encounter for screening mammogram for malignant neoplasm of breast: Secondary | ICD-10-CM

## 2021-01-15 ENCOUNTER — Encounter: Payer: PRIVATE HEALTH INSURANCE | Admitting: Family Medicine

## 2021-01-31 ENCOUNTER — Encounter: Payer: Self-pay | Admitting: Family Medicine

## 2021-01-31 DIAGNOSIS — R7401 Elevation of levels of liver transaminase levels: Secondary | ICD-10-CM

## 2021-01-31 DIAGNOSIS — Z0000 Encounter for general adult medical examination without abnormal findings: Secondary | ICD-10-CM

## 2021-02-06 ENCOUNTER — Encounter: Payer: Self-pay | Admitting: Family Medicine

## 2021-02-06 ENCOUNTER — Ambulatory Visit (INDEPENDENT_AMBULATORY_CARE_PROVIDER_SITE_OTHER): Payer: No Typology Code available for payment source | Admitting: Family Medicine

## 2021-02-06 VITALS — BP 113/65 | HR 75 | Temp 98.4°F | Ht 64.0 in | Wt 189.0 lb

## 2021-02-06 DIAGNOSIS — Z0000 Encounter for general adult medical examination without abnormal findings: Secondary | ICD-10-CM

## 2021-02-06 DIAGNOSIS — Z23 Encounter for immunization: Secondary | ICD-10-CM

## 2021-02-06 MED ORDER — ALBUTEROL SULFATE HFA 108 (90 BASE) MCG/ACT IN AERS: 2.0000 | INHALATION_SPRAY | Freq: Four times a day (QID) | RESPIRATORY_TRACT | 3 refills | Status: DC | PRN

## 2021-02-06 NOTE — Progress Notes (Signed)
Subjective:     Kathy Kaiser is a 45 y.o. female and is here for a comprehensive physical exam. The patient reports no problems.  She is doing well overall she is exercising by doing hot yoga, water aerobics and working on doing weight training.  She feels like her mood is overall in a good place.  She has had her COVID vaccines but wanted to let me know on her booster she did experience chest pain that lasted for about 3 days but it eventually eased off.  Social History   Socioeconomic History   Marital status: Married    Spouse name: Corene Cornea   Number of children: 2   Years of education: Not on file   Highest education level: Not on file  Occupational History   Occupation: Case Manager  Tobacco Use   Smoking status: Never   Smokeless tobacco: Never  Substance and Sexual Activity   Alcohol use: Yes    Comment: rarely   Drug use: No   Sexual activity: Yes    Partners: Male  Other Topics Concern   Not on file  Social History Narrative   Occ exercise 3-4 days per week. . Daily caffeine 1-2 per day.    Social Determinants of Health   Financial Resource Strain: Not on file  Food Insecurity: Not on file  Transportation Needs: Not on file  Physical Activity: Not on file  Stress: Not on file  Social Connections: Not on file  Intimate Partner Violence: Not on file   Health Maintenance  Topic Date Due   Hepatitis C Screening  Never done   Pneumococcal Vaccine 98-33 Years old (2 - PCV) 11/11/2014   COVID-19 Vaccine (3 - Pfizer risk series) 09/22/2019   INFLUENZA VACCINE  02/05/2021   PAP SMEAR-Modifier  01/12/2025   TETANUS/TDAP  02/07/2031   HIV Screening  Completed   HPV VACCINES  Aged Out    The following portions of the patient's history were reviewed and updated as appropriate: allergies, current medications, past family history, past medical history, past social history, past surgical history, and problem list.  Review of Systems Pertinent items are noted in HPI.    Objective:    BP 113/65   Pulse 75   Temp 98.4 F (36.9 C) (Oral)   Ht '5\' 4"'$  (1.626 m)   Wt 189 lb (85.7 kg)   LMP 02/05/2021 (Exact Date)   SpO2 100% Comment: on RA  BMI 32.44 kg/m  General appearance: alert, cooperative, and appears stated age Head: Normocephalic, without obvious abnormality, atraumatic Eyes:  conj clear, EOMI, PEERLA Ears: normal TM's and external ear canals both ears Nose: Nares normal. Septum midline. Mucosa normal. No drainage or sinus tenderness. Throat: lips, mucosa, and tongue normal; teeth and gums normal Neck: no adenopathy, no carotid bruit, no JVD, supple, symmetrical, trachea midline, and thyroid not enlarged, symmetric, no tenderness/mass/nodules Back: symmetric, no curvature. ROM normal. No CVA tenderness. Lungs: clear to auscultation bilaterally Heart: regular rate and rhythm, S1, S2 normal, no murmur, click, rub or gallop Abdomen: soft, non-tender; bowel sounds normal; no masses,  no organomegaly Extremities: extremities normal, atraumatic, no cyanosis or edema Pulses: 2+ and symmetric Skin: Skin color, texture, turgor normal. No rashes or lesions Lymph nodes: Cervical adenopathy: nl and Supraclavicular adenopathy: nl Neurologic: Grossly normal    Assessment:    Healthy female exam.    Plan:     See After Visit Summary for Counseling Recommendations  Keep up a regular exercise program and  make sure you are eating a healthy diet Try to eat 4 servings of dairy a day, or if you are lactose intolerant take a calcium with vitamin D daily.  Your vaccines are up to date.  Albuterol refilled today.  She will go for her labs tomorrow morning.  Orders Placed This Encounter  Procedures   Pneumococcal conjugate vaccine 20-valent (Prevnar 20)   Tdap vaccine greater than or equal to 7yo IM

## 2021-02-07 ENCOUNTER — Other Ambulatory Visit: Payer: Self-pay | Admitting: Family Medicine

## 2021-02-07 ENCOUNTER — Other Ambulatory Visit: Payer: Self-pay | Admitting: *Deleted

## 2021-02-07 DIAGNOSIS — Z0000 Encounter for general adult medical examination without abnormal findings: Secondary | ICD-10-CM

## 2021-02-07 DIAGNOSIS — R7401 Elevation of levels of liver transaminase levels: Secondary | ICD-10-CM

## 2021-02-08 LAB — CBC WITH DIFFERENTIAL/PLATELET
Absolute Monocytes: 548 cells/uL (ref 200–950)
Basophils Absolute: 33 cells/uL (ref 0–200)
Basophils Relative: 0.4 %
Eosinophils Absolute: 116 cells/uL (ref 15–500)
Eosinophils Relative: 1.4 %
HCT: 43.7 % (ref 35.0–45.0)
Hemoglobin: 14.4 g/dL (ref 11.7–15.5)
Lymphs Abs: 1071 cells/uL (ref 850–3900)
MCH: 32 pg (ref 27.0–33.0)
MCHC: 33 g/dL (ref 32.0–36.0)
MCV: 97.1 fL (ref 80.0–100.0)
MPV: 11.1 fL (ref 7.5–12.5)
Monocytes Relative: 6.6 %
Neutro Abs: 6532 cells/uL (ref 1500–7800)
Neutrophils Relative %: 78.7 %
Platelets: 226 10*3/uL (ref 140–400)
RBC: 4.5 10*6/uL (ref 3.80–5.10)
RDW: 11.8 % (ref 11.0–15.0)
Total Lymphocyte: 12.9 %
WBC: 8.3 10*3/uL (ref 3.8–10.8)

## 2021-02-08 LAB — COMPLETE METABOLIC PANEL WITH GFR
AG Ratio: 1.7 (calc) (ref 1.0–2.5)
ALT: 15 U/L (ref 6–29)
AST: 14 U/L (ref 10–30)
Albumin: 4.4 g/dL (ref 3.6–5.1)
Alkaline phosphatase (APISO): 32 U/L (ref 31–125)
BUN: 11 mg/dL (ref 7–25)
CO2: 28 mmol/L (ref 20–32)
Calcium: 9 mg/dL (ref 8.6–10.2)
Chloride: 103 mmol/L (ref 98–110)
Creat: 0.59 mg/dL (ref 0.50–0.99)
Globulin: 2.6 g/dL (calc) (ref 1.9–3.7)
Glucose, Bld: 91 mg/dL (ref 65–99)
Potassium: 4.1 mmol/L (ref 3.5–5.3)
Sodium: 139 mmol/L (ref 135–146)
Total Bilirubin: 1.1 mg/dL (ref 0.2–1.2)
Total Protein: 7 g/dL (ref 6.1–8.1)
eGFR: 115 mL/min/{1.73_m2} (ref >=60)

## 2021-02-08 LAB — LIPID PANEL W/REFLEX DIRECT LDL
Cholesterol: 190 mg/dL (ref <200)
HDL: 62 mg/dL (ref >=50)
LDL Cholesterol (Calc): 113 mg/dL (calc) — ABNORMAL HIGH
Non-HDL Cholesterol (Calc): 128 mg/dL (calc) (ref <130)
Total CHOL/HDL Ratio: 3.1 (calc) (ref <5.0)
Triglycerides: 68 mg/dL (ref <150)

## 2021-02-08 LAB — THYROID PANEL WITH TSH
Free Thyroxine Index: 2.3 (ref 1.4–3.8)
T3 Uptake: 31 % (ref 22–35)
T4, Total: 7.5 ug/dL (ref 5.1–11.9)
TSH: 1.69 mIU/L

## 2021-07-27 ENCOUNTER — Other Ambulatory Visit: Payer: Self-pay | Admitting: Family Medicine

## 2021-07-27 DIAGNOSIS — Z1231 Encounter for screening mammogram for malignant neoplasm of breast: Secondary | ICD-10-CM

## 2022-01-10 ENCOUNTER — Ambulatory Visit
Admission: RE | Admit: 2022-01-10 | Discharge: 2022-01-10 | Disposition: A | Discharge status: Home / Self Care | Payer: PRIVATE HEALTH INSURANCE | Source: Ambulatory Visit | Attending: Family Medicine | Admitting: Family Medicine

## 2022-01-10 DIAGNOSIS — Z1231 Encounter for screening mammogram for malignant neoplasm of breast: Secondary | ICD-10-CM

## 2022-01-11 NOTE — Progress Notes (Signed)
Please call patient. Normal mammogram.  Repeat in 1 year.  

## 2022-02-28 ENCOUNTER — Encounter: Payer: Self-pay | Admitting: Family Medicine

## 2022-02-28 DIAGNOSIS — Z0000 Encounter for general adult medical examination without abnormal findings: Secondary | ICD-10-CM

## 2022-02-28 DIAGNOSIS — G35 Multiple sclerosis: Secondary | ICD-10-CM

## 2022-03-12 ENCOUNTER — Ambulatory Visit (INDEPENDENT_AMBULATORY_CARE_PROVIDER_SITE_OTHER): Payer: PRIVATE HEALTH INSURANCE | Admitting: Family Medicine

## 2022-03-12 ENCOUNTER — Encounter: Payer: Self-pay | Admitting: Family Medicine

## 2022-03-12 VITALS — BP 122/85 | HR 77 | Ht 64.0 in | Wt 197.0 lb

## 2022-03-12 DIAGNOSIS — Z0000 Encounter for general adult medical examination without abnormal findings: Secondary | ICD-10-CM

## 2022-03-12 MED ORDER — ALBUTEROL SULFATE HFA 108 (90 BASE) MCG/ACT IN AERS
2.0000 | INHALATION_SPRAY | Freq: Four times a day (QID) | RESPIRATORY_TRACT | 3 refills | Status: DC | PRN
Start: 2022-03-12 — End: 2023-03-19

## 2022-03-12 NOTE — Progress Notes (Signed)
Complete physical exam  Patient: Kathy Kaiser   DOB: 1975-08-08   46 y.o. Female  MRN: [id number]  Subjective:    Chief Complaint  Patient presents with   Annual Exam    Kathy Kaiser is a 46 y.o. female who presents today for a complete physical exam. She reports consuming a general diet.  Does water aerobics 3 days a week and body pump 1 day a week.  Also walks.  She generally feels well. She reports sleeping well. She does not have additional problems to discuss today.  Will avoiding gluten and dairy.  He will try to eat really healthy.  Planning on getting her flu shot at work this fall.   Most recent fall risk assessment:    03/12/2022    3:34 PM  [place] in the past year? 0  Number falls in past yr: 0  Injury with Fall? 0  Risk for fall due to : No Fall Risks  Follow up Falls evaluation completed     Most recent depression screenings:    02/06/2021    3:03 PM 09/06/2019    8:08 AM  PHQ 2/9 Scores  PHQ - 2 Score 0 0  PHQ- 9 Score  0        Patient Care Team: Hali Marry, MD as PCP - General (Family Medicine)   Outpatient Medications Prior to Visit  Medication Sig   BACILLUS COAGULANS-INULIN PO Take by mouth.   beclomethasone (QVAR) 40 MCG/ACT inhaler Inhale 2 puffs into the lungs 2 (two) times daily.   Cholecalciferol (D 5000 PO) Take 1 capsule by mouth daily.   Coenzyme Q10-Fish Oil-Vit E (CO-Q 10 OMEGA-3 FISH OIL PO) Take 1 capsule by mouth daily.   NON FORMULARY Take 1 drop by mouth at bedtime.   TECFIDERA 240 MG CPDR Take 1 capsule by mouth 2 (two) times a day.   [DISCONTINUED] albuterol (VENTOLIN HFA) 108 (90 Base) MCG/ACT inhaler Inhale 2 puffs into the lungs every 6 (six) hours as needed for wheezing or shortness of breath.   No facility-administered medications prior to visit.    ROS        Objective:     BP 122/85   Pulse 77   Ht '5\' 4"'$  (1.626 m)   Wt 197 lb (89.4 kg)   SpO2 100%   BMI 33.81 kg/m    Physical  Exam Vitals reviewed.  Constitutional:      Appearance: Normal appearance. She is well-developed.  HENT:     Head: Normocephalic and atraumatic.     Right Ear: Tympanic membrane, ear canal and external ear normal.     Left Ear: Tympanic membrane, ear canal and external ear normal.     Nose: Nose normal.     Mouth/Throat:     Pharynx: Oropharynx is clear.  Eyes:     Conjunctiva/sclera: Conjunctivae normal.     Pupils: Pupils are equal, round, and reactive to light.  Neck:     Thyroid: No thyromegaly.  Cardiovascular:     Rate and Rhythm: Normal rate and regular rhythm.     Heart sounds: Normal heart sounds.  Pulmonary:     Effort: Pulmonary effort is normal.     Breath sounds: Normal breath sounds. No wheezing.  Abdominal:     General: Bowel sounds are normal.     Palpations: Abdomen is soft.  Musculoskeletal:     Cervical back: Neck supple.  Lymphadenopathy:  Cervical: No cervical adenopathy.  Skin:    General: Skin is warm and dry.     Coloration: Skin is not pale.  Neurological:     General: No focal deficit present.     Mental Status: She is alert and oriented to person, place, and time.  Psychiatric:        Mood and Affect: Mood normal.        Behavior: Behavior normal.      No results found for any visits on 03/12/22.     Assessment & Plan:    Routine Health Maintenance and Physical Exam  Keep up a regular exercise program and make sure you are eating a healthy diet Try to eat 4 servings of dairy a day, or if you are lactose intolerant take a calcium with vitamin D daily.  Your vaccines are up to date.    Immunization History  Administered Date(s) Administered   Influenza Split 03/08/2012   Influenza,inj,Quad PF,6+ Mos 03/08/2012, 04/20/2013, 03/08/2014, 04/13/2014, 04/25/2015, 04/10/2016, 05/02/2017, 03/25/2018   Influenza,trivalent, recombinat, inj, PF 04/20/2013   Influenza-Unspecified 04/14/2019   PFIZER(Purple Top)SARS-COV-2 Vaccination  08/04/2019, 08/25/2019   PNEUMOCOCCAL CONJUGATE-20 02/06/2021   Pneumococcal Polysaccharide-23 11/10/2013   Tdap 04/08/2011, 02/06/2021    Health Maintenance  Topic Date Due   Hepatitis C Screening  Never done   COVID-19 Vaccine (3 - Pfizer risk series) 09/22/2019   INFLUENZA VACCINE  10/06/2022 (Originally 02/05/2022)   PAP SMEAR-Modifier  01/12/2025   TETANUS/TDAP  02/07/2031   HIV Screening  Completed   Pneumococcal Vaccine 79-54 Years old  Aged Out   HPV VACCINES  Aged Out    Discussed health benefits of physical activity, and encouraged her to engage in regular exercise appropriate for her age and condition.  Problem List Items Addressed This Visit   None Visit Diagnoses     Wellness examination    -  Primary      Return in about 1 year (around 03/13/2023) for Wellness Exam.    Meds ordered this encounter  Medications   albuterol (VENTOLIN HFA) 108 (90 Base) MCG/ACT inhaler    Sig: Inhale 2 puffs into the lungs every 6 (six) hours as needed for wheezing or shortness of breath.    Dispense:  18 g    Refill:  3     Beatrice Lecher, MD

## 2022-03-13 LAB — LIPID PANEL W/REFLEX DIRECT LDL
Cholesterol: 202 mg/dL — ABNORMAL HIGH (ref <200)
HDL: 73 mg/dL (ref >=50)
LDL Cholesterol (Calc): 113 mg/dL (calc) — ABNORMAL HIGH
Non-HDL Cholesterol (Calc): 129 mg/dL (calc) (ref <130)
Total CHOL/HDL Ratio: 2.8 (calc) (ref <5.0)
Triglycerides: 73 mg/dL (ref <150)

## 2022-03-13 LAB — CBC WITH DIFFERENTIAL/PLATELET
Absolute Monocytes: 280 cells/uL (ref 200–950)
Basophils Absolute: 70 cells/uL (ref 0–200)
Basophils Relative: 1.4 %
Eosinophils Absolute: 110 cells/uL (ref 15–500)
Eosinophils Relative: 2.2 %
HCT: 43.3 % (ref 35.0–45.0)
Hemoglobin: 14.6 g/dL (ref 11.7–15.5)
Lymphs Abs: 1270 cells/uL (ref 850–3900)
MCH: 32.4 pg (ref 27.0–33.0)
MCHC: 33.7 g/dL (ref 32.0–36.0)
MCV: 96.2 fL (ref 80.0–100.0)
MPV: 10.6 fL (ref 7.5–12.5)
Monocytes Relative: 5.6 %
Neutro Abs: 3270 cells/uL (ref 1500–7800)
Neutrophils Relative %: 65.4 %
Platelets: 219 10*3/uL (ref 140–400)
RBC: 4.5 10*6/uL (ref 3.80–5.10)
RDW: 11.9 % (ref 11.0–15.0)
Total Lymphocyte: 25.4 %
WBC: 5 10*3/uL (ref 3.8–10.8)

## 2022-03-13 LAB — COMPLETE METABOLIC PANEL WITH GFR
AG Ratio: 1.7 (calc) (ref 1.0–2.5)
ALT: 19 U/L (ref 6–29)
AST: 16 U/L (ref 10–30)
Albumin: 4.5 g/dL (ref 3.6–5.1)
Alkaline phosphatase (APISO): 30 U/L — ABNORMAL LOW (ref 31–125)
BUN: 11 mg/dL (ref 7–25)
CO2: 23 mmol/L (ref 20–32)
Calcium: 9.3 mg/dL (ref 8.6–10.2)
Chloride: 104 mmol/L (ref 98–110)
Creat: 0.63 mg/dL (ref 0.50–0.99)
Globulin: 2.7 g/dL (calc) (ref 1.9–3.7)
Glucose, Bld: 84 mg/dL (ref 65–99)
Potassium: 4.9 mmol/L (ref 3.5–5.3)
Sodium: 137 mmol/L (ref 135–146)
Total Bilirubin: 0.6 mg/dL (ref 0.2–1.2)
Total Protein: 7.2 g/dL (ref 6.1–8.1)
eGFR: 113 mL/min/{1.73_m2} (ref >=60)

## 2022-03-13 LAB — HEPATITIS C ANTIBODY: Hepatitis C Ab: NONREACTIVE

## 2022-03-13 LAB — VITAMIN D 25 HYDROXY (VIT D DEFICIENCY, FRACTURES): Vit D, 25-Hydroxy: 66 ng/mL (ref 30–100)

## 2022-03-13 LAB — VITAMIN B12: Vitamin B-12: 1134 pg/mL — ABNORMAL HIGH (ref 200–1100)

## 2022-03-14 NOTE — Progress Notes (Signed)
Kathy Kaiser, your LDL cholesterol is just borderline elevated similar to the last year.  Good cholesterol looks great though!  Your metabolic panel is also normal.  B12 looks great!  Vitamin D is normal and blood count is also normal no sign of anemia.  Negative for hepatitis C.  We just screen once in your lifetime.

## 2022-08-23 ENCOUNTER — Other Ambulatory Visit: Payer: Self-pay

## 2022-08-23 DIAGNOSIS — Z1231 Encounter for screening mammogram for malignant neoplasm of breast: Secondary | ICD-10-CM

## 2023-01-13 ENCOUNTER — Ambulatory Visit
Admission: RE | Admit: 2023-01-13 | Discharge: 2023-01-13 | Disposition: A | Discharge status: Home / Self Care | Payer: PRIVATE HEALTH INSURANCE | Source: Ambulatory Visit | Attending: Family Medicine | Admitting: Family Medicine

## 2023-01-13 DIAGNOSIS — Z1231 Encounter for screening mammogram for malignant neoplasm of breast: Secondary | ICD-10-CM

## 2023-01-14 NOTE — Progress Notes (Signed)
Please call patient. Normal mammogram.  Repeat in 1 year.  

## 2023-03-19 ENCOUNTER — Encounter: Payer: Self-pay | Admitting: Family Medicine

## 2023-03-19 ENCOUNTER — Ambulatory Visit (INDEPENDENT_AMBULATORY_CARE_PROVIDER_SITE_OTHER): Payer: PRIVATE HEALTH INSURANCE | Admitting: Family Medicine

## 2023-03-19 VITALS — BP 111/68 | HR 73 | Ht 64.0 in | Wt 203.0 lb

## 2023-03-19 DIAGNOSIS — J4530 Mild persistent asthma, uncomplicated: Secondary | ICD-10-CM

## 2023-03-19 DIAGNOSIS — Z0000 Encounter for general adult medical examination without abnormal findings: Secondary | ICD-10-CM

## 2023-03-19 MED ORDER — QVAR REDIHALER 40 MCG/ACT IN AERB: 2.0000 | INHALATION_SPRAY | Freq: Two times a day (BID) | RESPIRATORY_TRACT | 5 refills | Status: AC

## 2023-03-19 MED ORDER — ALBUTEROL SULFATE HFA 108 (90 BASE) MCG/ACT IN AERS: 2.0000 | INHALATION_SPRAY | Freq: Four times a day (QID) | RESPIRATORY_TRACT | 3 refills | Status: AC | PRN

## 2023-03-19 NOTE — Addendum Note (Signed)
Addended by: Nani Gasser D on: 03/19/2023 07:51 PM   Modules accepted: Orders

## 2023-03-19 NOTE — Progress Notes (Addendum)
Complete physical exam  Patient: Kathy Kaiser   DOB: 01-14-1976   47 y.o. Female  MRN: [id number]  Subjective:    Chief Complaint  Patient presents with   Annual Exam    Kathy Kaiser is a 47 y.o. female who presents today for a complete physical exam. She reports consuming a general diet.  Walking for exercise   She generally feels well. She reports sleeping well. She does not have additional problems to discuss today.   She is struggling with some perimenopausal sxs.    Most recent fall risk assessment:    03/19/2023    8:31 AM  Fall Risk   Falls in the past year? 0  Number falls in past yr: 0  Injury with Fall? 0  Risk for fall due to : No Fall Risks  Follow up Falls evaluation completed     Most recent depression screenings:    03/19/2023    8:31 AM 02/06/2021    3:03 PM  PHQ 2/9 Scores  PHQ - 2 Score 1 0      Past Surgical History:  Procedure Laterality Date   APPENDECTOMY  1989   BREAST BIOPSY Right 01/2019   CESAREAN SECTION  06/2005    06/2009   CHOLECYSTECTOMY     KNEE SURGERY        Patient Care Team: Agapito Games, MD as PCP - General (Family Medicine)   Outpatient Medications Prior to Visit  Medication Sig   BACILLUS COAGULANS-INULIN PO Take by mouth.   Cholecalciferol (D 5000 PO) Take 1 capsule by mouth daily.   Coenzyme Q10-Fish Oil-Vit E (CO-Q 10 OMEGA-3 FISH OIL PO) Take 1 capsule by mouth daily.   TECFIDERA 240 MG CPDR Take 1 capsule by mouth 2 (two) times a day.   [DISCONTINUED] albuterol (VENTOLIN HFA) 108 (90 Base) MCG/ACT inhaler Inhale 2 puffs into the lungs every 6 (six) hours as needed for wheezing or shortness of breath.   [DISCONTINUED] beclomethasone (QVAR) 40 MCG/ACT inhaler Inhale 2 puffs into the lungs 2 (two) times daily.   NON FORMULARY Take 1 drop by mouth at bedtime. CBD   No facility-administered medications prior to visit.    ROS        Objective:     BP 111/68   Pulse 73   Ht 5\' 4"  (1.626 m)    Wt 203 lb (92.1 kg)   SpO2 100%   BMI 34.84 kg/m     Physical Exam Constitutional:      Appearance: Normal appearance.  HENT:     Head: Normocephalic and atraumatic.     Right Ear: Tympanic membrane, ear canal and external ear normal.     Left Ear: Tympanic membrane, ear canal and external ear normal.     Nose: Nose normal.     Mouth/Throat:     Pharynx: Oropharynx is clear.  Eyes:     Extraocular Movements: Extraocular movements intact.     Conjunctiva/sclera: Conjunctivae normal.     Pupils: Pupils are equal, round, and reactive to light.  Neck:     Thyroid: No thyromegaly.  Cardiovascular:     Rate and Rhythm: Normal rate and regular rhythm.  Pulmonary:     Effort: Pulmonary effort is normal.     Breath sounds: Normal breath sounds.  Abdominal:     General: Bowel sounds are normal.     Palpations: Abdomen is soft.     Tenderness: There is no abdominal tenderness.  Musculoskeletal:  General: No swelling.     Cervical back: Neck supple.  Skin:    General: Skin is warm and dry.  Neurological:     Mental Status: She is oriented to person, place, and time.  Psychiatric:        Mood and Affect: Mood normal.        Behavior: Behavior normal.      No results found for any visits on 03/19/23.      Assessment & Plan:    Routine Health Maintenance and Physical Exam  Immunization History  Administered Date(s) Administered   Influenza Split 03/08/2012   Influenza,inj,Quad PF,6+ Mos 03/08/2012, 04/20/2013, 03/08/2014, 04/13/2014, 04/25/2015, 04/10/2016, 05/02/2017, 03/25/2018   Influenza,trivalent, recombinat, inj, PF 04/20/2013   Influenza-Unspecified 04/14/2019   PFIZER(Purple Top)SARS-COV-2 Vaccination 08/04/2019, 08/25/2019   PNEUMOCOCCAL CONJUGATE-20 02/06/2021   Pneumococcal Polysaccharide-23 11/10/2013   Tdap 04/08/2011, 02/06/2021    Health Maintenance  Topic Date Due   INFLUENZA VACCINE  10/06/2023 (Originally 02/06/2023)   COVID-19 Vaccine (3 -  Pfizer risk series) 04/03/2024 (Originally 09/22/2019)   PAP SMEAR-Modifier  01/12/2025   DTaP/Tdap/Td (3 - Td or Tdap) 02/07/2031   Hepatitis C Screening  Completed   HIV Screening  Completed   Pneumococcal Vaccine 69-8 Years old  Aged Out   HPV VACCINES  Aged Out    Discussed health benefits of physical activity, and encouraged her to engage in regular exercise appropriate for her age and condition.  Problem List Items Addressed This Visit       Respiratory   Asthma   Relevant Medications   albuterol (VENTOLIN HFA) 108 (90 Base) MCG/ACT inhaler   beclomethasone (QVAR REDIHALER) 40 MCG/ACT inhaler   Other Visit Diagnoses     Wellness examination    -  Primary   Relevant Orders   CMP14+EGFR   Lipid panel   CBC       Keep up a regular exercise program and make sure you are eating a healthy diet Try to eat 4 servings of dairy a day, or if you are lactose intolerant take a calcium with vitamin D daily.  Your vaccines are up to date.   No follow-ups on file.     Nani Gasser, MD

## 2023-03-20 ENCOUNTER — Encounter: Payer: Self-pay | Admitting: Family Medicine

## 2023-03-20 DIAGNOSIS — R718 Other abnormality of red blood cells: Secondary | ICD-10-CM

## 2023-03-20 DIAGNOSIS — G35 Multiple sclerosis: Secondary | ICD-10-CM

## 2023-03-20 LAB — CMP14+EGFR
ALT: 22 IU/L (ref 0–32)
AST: 18 IU/L (ref 0–40)
Albumin: 4.6 g/dL (ref 3.9–4.9)
Alkaline Phosphatase: 42 IU/L — ABNORMAL LOW (ref 44–121)
BUN/Creatinine Ratio: 11 (ref 9–23)
BUN: 6 mg/dL (ref 6–24)
Bilirubin Total: 0.5 mg/dL (ref 0.0–1.2)
CO2: 22 mmol/L (ref 20–29)
Calcium: 9.4 mg/dL (ref 8.7–10.2)
Chloride: 103 mmol/L (ref 96–106)
Creatinine, Ser: 0.56 mg/dL — ABNORMAL LOW (ref 0.57–1.00)
Globulin, Total: 2.7 g/dL (ref 1.5–4.5)
Glucose: 77 mg/dL (ref 70–99)
Potassium: 4.4 mmol/L (ref 3.5–5.2)
Sodium: 140 mmol/L (ref 134–144)
Total Protein: 7.3 g/dL (ref 6.0–8.5)
eGFR: 115 mL/min/{1.73_m2} (ref >59)

## 2023-03-20 LAB — CBC
Hematocrit: 45.1 % (ref 34.0–46.6)
Hemoglobin: 14.7 g/dL (ref 11.1–15.9)
MCH: 32.8 pg (ref 26.6–33.0)
MCHC: 32.6 g/dL (ref 31.5–35.7)
MCV: 101 fL — ABNORMAL HIGH (ref 79–97)
Platelets: 266 10*3/uL (ref 150–450)
RBC: 4.48 x10E6/uL (ref 3.77–5.28)
RDW: 12.3 % (ref 11.7–15.4)
WBC: 6.7 10*3/uL (ref 3.4–10.8)

## 2023-03-20 LAB — LIPID PANEL
Chol/HDL Ratio: 3 ratio (ref 0.0–4.4)
Cholesterol, Total: 189 mg/dL (ref 100–199)
HDL: 64 mg/dL (ref >39)
LDL Chol Calc (NIH): 111 mg/dL — ABNORMAL HIGH (ref 0–99)
Triglycerides: 74 mg/dL (ref 0–149)
VLDL Cholesterol Cal: 14 mg/dL (ref 5–40)

## 2023-03-20 NOTE — Progress Notes (Signed)
Hi Kathy Kaiser, your metabolic panel overall looks good.  No concerns.  The alk phos is a little low but that is not concerning it is only worrisome if it is elevated.  Your LDL is just a little above goal at 111.  We want it under 100 so just encouraged to continue to work on those healthy diet and staying active.  Your blood count overall looks good.  No sign of anemia.

## 2023-03-21 NOTE — Addendum Note (Signed)
Addended by: Nani Gasser D on: 03/21/2023 05:07 PM   Modules accepted: Orders

## 2023-03-26 NOTE — Progress Notes (Signed)
Hi Marjie, your B12 and folate look great!!  So this is not causing the size of the cells to be a little bit larger.  This can be seen with certain liver diseases but you do not have that in with smoking which you do not.  So for now we will just keep an eye on it since is just a little borderline elevated.  Your thyroid panel looks great.

## 2023-10-21 DIAGNOSIS — I472 Ventricular tachycardia, unspecified: Secondary | ICD-10-CM | POA: Insufficient documentation

## 2023-10-22 ENCOUNTER — Telehealth: Payer: Self-pay

## 2023-10-24 ENCOUNTER — Encounter: Payer: Self-pay | Admitting: Family Medicine

## 2023-10-28 ENCOUNTER — Telehealth: Payer: Self-pay

## 2023-10-28 NOTE — Transitions of Care (Post Inpatient/ED Visit) (Signed)
   10/28/2023  Name: Kathy Kaiser MRN: [id number] DOB: June 07, 1976  Today's TOC FU Call Status: Today's TOC FU Call Status:: Successful TOC FU Call Completed TOC FU Call Complete Date: 10/28/23 Patient's Name and Date of Birth confirmed.  Transition Care Management Follow-up Telephone Call Date of Discharge: 10/21/23 Discharge Facility: [facility] Type of Discharge: Inpatient Admission Primary Inpatient Discharge Diagnosis:: tachycardia How have you been since you were released from the hospital?: Better Any questions or concerns?: No  Items Reviewed: Did you receive and understand the discharge instructions provided?: Yes Medications obtained,verified, and reconciled?: Yes (Medications Reviewed) Any new allergies since your discharge?: No Dietary orders reviewed?: Yes Do you have support at home?: Yes People in Home [RPT]: spouse  Medications Reviewed Today: Medications Reviewed Today     Reviewed by Darrall Ellison, LPN (Licensed Practical Nurse) on 10/28/23 at 1502  Med List Status: <None>   Medication Order Taking? Sig Documenting Provider Last Dose Status Informant  albuterol  (VENTOLIN  HFA) 108 (90 Base) MCG/ACT inhaler [id number]  Inhale 2 puffs into the lungs every 6 (six) hours as needed for wheezing or shortness of breath. Cydney Draft, MD  Active   amiodarone (PACERONE) 200 MG tablet [id number] Yes Take 200 mg by mouth daily. [provider]  Active   BACILLUS COAGULANS-INULIN PO [id number]  Take by mouth. [provider]  Active   beclomethasone (QVAR  REDIHALER) 40 MCG/ACT inhaler [id number]  Inhale 2 puffs into the lungs 2 (two) times daily. Cydney Draft, MD  Active   Cholecalciferol (D 5000 PO) [id number]  Take 1 capsule by mouth daily. [provider]  Active Self  Coenzyme Q10-Fish Oil-Vit E (CO-Q 10 OMEGA-3 FISH OIL PO) [id number]  Take 1 capsule by mouth daily. [provider]  Active Self  magnesium  oxide (MAG-OX) 400 MG tablet [id number] Yes Take 400 mg by mouth daily. [provider]  Active   Kennedy Peabody [id number]  Take 1 drop by mouth at bedtime. CBD [provider]  Active Self  TECFIDERA 240 MG CPDR [id number]  Take 1 capsule by mouth 2 (two) times a day. Zeid, Nuhad Abou, MD  Active             Home Care and Equipment/Supplies: Were Home Health Services Ordered?: NA Any new equipment or medical supplies ordered?: NA  Functional Questionnaire: Do you need assistance with bathing/showering or dressing?: No Do you need assistance with meal preparation?: No Do you need assistance with eating?: No Do you have difficulty maintaining continence: No Do you need assistance with getting out of bed/getting out of a chair/moving?: No Do you have difficulty managing or taking your medications?: No  Follow up appointments reviewed: PCP Follow-up appointment confirmed?: Yes Date of PCP follow-up appointment?: 10/30/23 Follow-up Provider: [facility] Follow-up appointment confirmed?: NA Do you need transportation to your follow-up appointment?: No Do you understand care options if your condition(s) worsen?: Yes-patient verbalized understanding    SIGNATURE Darrall Ellison, LPN [facility] Nurse Health Advisor Direct Dial [phone]

## 2023-10-29 NOTE — Telephone Encounter (Signed)
 j

## 2023-10-30 ENCOUNTER — Encounter: Payer: Self-pay | Admitting: Family Medicine

## 2023-10-30 ENCOUNTER — Ambulatory Visit: Payer: PRIVATE HEALTH INSURANCE | Admitting: Family Medicine

## 2023-10-30 VITALS — BP 127/74 | HR 72 | Ht 64.0 in | Wt 209.0 lb

## 2023-10-30 DIAGNOSIS — G479 Sleep disorder, unspecified: Secondary | ICD-10-CM

## 2023-10-30 DIAGNOSIS — I472 Ventricular tachycardia, unspecified: Secondary | ICD-10-CM

## 2023-10-30 DIAGNOSIS — I809 Phlebitis and thrombophlebitis of unspecified site: Secondary | ICD-10-CM

## 2023-10-30 DIAGNOSIS — F411 Generalized anxiety disorder: Secondary | ICD-10-CM

## 2023-10-30 MED ORDER — ALPRAZOLAM 0.25 MG PO TABS: 0.2500 mg | ORAL_TABLET | Freq: Every evening | ORAL | 0 refills | Status: DC | PRN

## 2023-10-30 NOTE — Progress Notes (Unsigned)
 Established Patient Office Visit  Subjective  Patient ID: SHAYLEEN EPPINGER, female    DOB: 08/23/1975  Age: 48 y.o. MRN: [id number]  Chief Complaint  Patient presents with   Hospitalization Follow-up    Pt was admitted for V-tach Upon discharge they have her taking Amiodarone 200 mg as follows:Take 1 tablet (200 mg total) by mouth 2 (two) times a day for 4 days, THEN 1 tablet (200 mg total) daily. And Magnesium Oxide 400 mg daily    HPI  Here for hospital follow-up she was admitted to [facility] on April 13 for palpitations that have been occurring for couple of days.  It happened initially after she had eaten pork and then actually took a Benadryl.  She ended up going to urgent care initially.  She had a wide-complex tachycardia on EKG and so was referred to the emergency department.  Then IV amiodarone.  Negative stress test.  Normal echo.  He discharged her home with plans for follow-up cardiology for additional EP studies.  Discharged home on oral amiodarone and magnesium.  Pt was admitted for V-tach Upon discharge they have her taking Amiodarone 200 mg as follows:Take 1 tablet (200 mg total) by mouth 2 (two) times a day for 4 days, THEN 1 tablet (200 mg total) daily. And Magnesium Oxide 400 mg daily she say she felt really bad on the higher dose of amio. Was having CP, that seems a little better but still not feeling well.   She is not sleeping well nad feeling anxious.  Hx of GAD.  She feels tired.    Also previously sent me a MyChart note in regards to an inflamed blood vessel in that left arm.    ROS    Objective:     BP 127/74   Pulse 72   Ht 5\' 4"  (1.626 m)   Wt 209 lb (94.8 kg)   SpO2 100%   BMI 35.87 kg/m    Physical Exam Vitals and nursing note reviewed.  Constitutional:      Appearance: Normal appearance.  HENT:     Head: Normocephalic and atraumatic.  Eyes:     Conjunctiva/sclera: Conjunctivae normal.  Cardiovascular:     Rate and Rhythm: Normal rate  and regular rhythm.  Pulmonary:     Effort: Pulmonary effort is normal.     Breath sounds: Normal breath sounds.  Skin:    General: Skin is warm and dry.     Comments: Able to palpate a cordlike swelling of the blood vessel in the left upper arm near the antecubital fossa.  No surrounding erythema or increased warmth.  Neurological:     Mental Status: She is alert.  Psychiatric:        Mood and Affect: Mood normal.      No results found for any visits on 10/30/23.    The 10-year ASCVD risk score (Arnett DK, et al., 2019) is: 0.6%    Assessment & Plan:   Problem List Items Addressed This Visit       Cardiovascular and Mediastinum   VT (ventricular tachycardia) (HCC)   On Amiodarone.  Does not feel great on the medication so hopefully this will just be temporary.  She has an upcoming appointment in a couple of weeks for ablation.        Other   GAD (generalized anxiety disorder)   Discussed in the short-term may be doing a trial of low-dose alprazolam .  She did have hallucinations with clonazepam  so  I want a be careful and just start with a really low dose maybe even split the tab in half.  Like to see if we can get her resting a little better at night and I think that would be helpful during the day.      Relevant Medications   ALPRAZolam  (XANAX ) 0.25 MG tablet   Other Visit Diagnoses       Sleep disturbance    -  Primary   Relevant Medications   ALPRAZolam  (XANAX ) 0.25 MG tablet     Phlebitis and thrombophlebitis           Right is-continue to wrap with Ace wrap for pressure she has been taking some ibuprofen it does seem to be getting gradually better on its own.  Okay to start doing a little gentle massage if she feels comfortable with that and it is not painful  .  Sleep disturbance-will try to help get her resting a little bit better at least for the short-term and see if this is helpful.  No follow-ups on file.    Duaine German, MD

## 2023-10-31 NOTE — Assessment & Plan Note (Signed)
 On Amiodarone.  Does not feel great on the medication so hopefully this will just be temporary.  She has an upcoming appointment in a couple of weeks for ablation.

## 2023-10-31 NOTE — Assessment & Plan Note (Signed)
 Discussed in the short-term may be doing a trial of low-dose alprazolam .  She did have hallucinations with clonazepam  so I want a be careful and just start with a really low dose maybe even split the tab in half.  Like to see if we can get her resting a little better at night and I think that would be helpful during the day.

## 2023-11-21 ENCOUNTER — Encounter: Payer: Self-pay | Admitting: Family Medicine

## 2023-11-21 DIAGNOSIS — G479 Sleep disorder, unspecified: Secondary | ICD-10-CM

## 2023-11-21 MED ORDER — ALPRAZOLAM 0.25 MG PO TABS: 0.2500 mg | ORAL_TABLET | Freq: Every evening | ORAL | 0 refills | Status: AC | PRN

## 2024-01-31 ENCOUNTER — Encounter: Payer: Self-pay | Admitting: Family Medicine

## 2024-01-31 DIAGNOSIS — Z1211 Encounter for screening for malignant neoplasm of colon: Secondary | ICD-10-CM

## 2024-02-23 ENCOUNTER — Other Ambulatory Visit: Payer: Self-pay | Admitting: Family Medicine

## 2024-02-23 DIAGNOSIS — Z1231 Encounter for screening mammogram for malignant neoplasm of breast: Secondary | ICD-10-CM

## 2024-03-11 ENCOUNTER — Ambulatory Visit
Admission: RE | Admit: 2024-03-11 | Discharge: 2024-03-11 | Disposition: A | Discharge status: Home / Self Care | Payer: PRIVATE HEALTH INSURANCE | Source: Ambulatory Visit | Attending: Family Medicine | Admitting: Family Medicine

## 2024-03-11 DIAGNOSIS — Z1231 Encounter for screening mammogram for malignant neoplasm of breast: Secondary | ICD-10-CM

## 2024-08-02 ENCOUNTER — Encounter: Payer: Self-pay | Admitting: Family Medicine

## 2024-08-02 ENCOUNTER — Telehealth: Payer: PRIVATE HEALTH INSURANCE | Admitting: Family Medicine

## 2024-08-02 DIAGNOSIS — Z1211 Encounter for screening for malignant neoplasm of colon: Secondary | ICD-10-CM

## 2024-08-02 DIAGNOSIS — R1013 Epigastric pain: Secondary | ICD-10-CM

## 2024-08-02 MED ORDER — PANTOPRAZOLE SODIUM 40 MG PO TBEC: 40.0000 mg | DELAYED_RELEASE_TABLET | Freq: Every day | ORAL | 1 refills | Status: AC

## 2024-08-02 NOTE — Progress Notes (Signed)
 "                    MyChart Video Visit    Virtual Visit via Video Note   This format is felt to be most appropriate for this patient at this time. Physical exam was limited by quality of the video and audio technology used for the visit.    Patient location: home Provider location: [facility] - working from home bc of weather Persons involved in the visit: patient, provider  I discussed the limitations of evaluation and management by telemedicine and the availability of in person appointments. The patient expressed understanding and agreed to proceed.  Patient: Kathy Kaiser   DOB: Nov 25, 1975   49 y.o. Female  MRN: [id number] Visit Date: 08/02/2024  Today's healthcare provider: Dorothyann Byars, MD   Chief Complaint  Patient presents with   Abdominal Pain    Subjective:    HPI  Discussed the use of AI scribe software for clinical note transcription with the patient, who gave verbal consent to proceed.  History of Present Illness [name] is a 49 year old female who presents with recurrent epigastric pain.  Epigastric pain - Recurrent episodes of sharp, intense pain localized to the epigastric region since October or November - Approximately ten episodes in total; four episodes occurred about twenty minutes after eating - Each episode typically lasts less than one minute, except for one episode that persisted for forty minutes during the night - Pain does not radiate to the back - Occasionally associated with a sensation affecting the lungs, described as wheezing or asthmatic feeling - No associated gastrointestinal upset, loose stools, or blood in bowel movements - No history of pancreatitis-like symptoms such as prolonged pain or nausea  Dietary triggers - Possible association with spicy foods; two episodes followed consumption of Indian food, one after eating a jerk rub dish - One episode occurred after  eating steak with a plain sweet potato, which is not typically problematic for her  Gastroesophageal symptoms - Increased heartburn and regurgitation recently - Occasional sensation of food coming back up - No issues with swallowing or food getting stuck - Does not take over-the-counter heartburn medications regularly - Takes a probiotic  Surgical history - History of cholecystectomy   ROS       Objective:    There were no vitals taken for this visit.  BP Readings from Last 3 Encounters:  10/30/23 127/74  03/19/23 111/68  03/12/22 122/85   Wt Readings from Last 3 Encounters:  10/30/23 209 lb (94.8 kg)  03/19/23 203 lb (92.1 kg)  03/12/22 197 lb (89.4 kg)        Physical Exam Vitals reviewed.  Constitutional:      Appearance: Normal appearance.  HENT:     Head: Normocephalic.  Pulmonary:     Effort: Pulmonary effort is normal.  Neurological:     Mental Status: She is alert and oriented to person, place, and time.  Psychiatric:        Mood and Affect: Mood normal.        Behavior: Behavior normal.         Assessment & Plan:    Problem List Items Addressed This Visit   None Visit Diagnoses       Epigastric pain    -  Primary   Relevant Medications   pantoprazole  (PROTONIX ) 40 MG tablet     Screen for colon  cancer       Relevant Orders   Ambulatory referral to Gastroenterology       Assessment and Plan Assessment & Plan Epigastric pain likely due to gastritis or gastroesophageal reflux disease Intermittent sharp epigastric pain, likely gastritis or GERD due to pain location and dietary triggers. Bile duct stone and nutcracker esophagus less likely. - Initiated 3-week trial of PPI. - If symptoms improve, continue PPI for 6 weeks, then taper. - Advised dietary modifications to minimize spicy, greasy, and acidic foods. - If symptoms persist after 3 weeks, consider GI referral for further evaluation, including possible upper  endoscopy.  Colorectal cancer screening Colonoscopy not yet completed, coordination with GI necessary for scheduling. - Placed referral for colonoscopy with GI. - Coordinated with GI to potentially schedule upper and lower endoscopy concurrently if needed.    Meds ordered this encounter  Medications   pantoprazole  (PROTONIX ) 40 MG tablet    Sig: Take 1 tablet (40 mg total) by mouth daily.    Dispense:  30 tablet    Refill:  1     No follow-ups on file.     I discussed the assessment and treatment plan with the patient. The patient was provided an opportunity to ask questions and all were answered. The patient agreed with the plan and demonstrated an understanding of the instructions.   The patient was advised to call back or seek an in-person evaluation if the symptoms worsen or if the condition fails to improve as anticipated.  I provided 20 minutes of non-face-to-face time during this encounter.  Dorothyann Byars, MD [facility]    "
